# Patient Record
Sex: Female | Born: 1970 | Race: Black or African American | Hispanic: No | Smoking: Never smoker
Health system: Southern US, Community
[De-identification: ages and names within clinical notes are randomized; demographics above are authoritative.]

## PROBLEM LIST (undated history)

## (undated) DIAGNOSIS — T4145XA Adverse effect of unspecified anesthetic, initial encounter: Secondary | ICD-10-CM

## (undated) DIAGNOSIS — G43909 Migraine, unspecified, not intractable, without status migrainosus: Secondary | ICD-10-CM

## (undated) DIAGNOSIS — K219 Gastro-esophageal reflux disease without esophagitis: Secondary | ICD-10-CM

## (undated) DIAGNOSIS — T8859XA Other complications of anesthesia, initial encounter: Secondary | ICD-10-CM

## (undated) DIAGNOSIS — E079 Disorder of thyroid, unspecified: Secondary | ICD-10-CM

## (undated) HISTORY — PX: ABDOMINAL HYSTERECTOMY: SHX81

## (undated) HISTORY — DX: Migraine, unspecified, not intractable, without status migrainosus: G43.909

## (undated) HISTORY — DX: Disorder of thyroid, unspecified: E07.9

---

## 1997-09-05 ENCOUNTER — Inpatient Hospital Stay (HOSPITAL_COMMUNITY): Admission: AD | Admit: 1997-09-05 | Discharge: 1997-09-05 | Payer: Self-pay | Admitting: Obstetrics & Gynecology

## 1997-09-30 ENCOUNTER — Other Ambulatory Visit: Admission: RE | Admit: 1997-09-30 | Discharge: 1997-09-30 | Payer: Self-pay | Admitting: Obstetrics and Gynecology

## 1997-10-16 ENCOUNTER — Inpatient Hospital Stay (HOSPITAL_COMMUNITY): Admission: AD | Admit: 1997-10-16 | Discharge: 1997-10-16 | Payer: Self-pay | Admitting: Obstetrics and Gynecology

## 1997-10-30 ENCOUNTER — Inpatient Hospital Stay (HOSPITAL_COMMUNITY): Admission: AD | Admit: 1997-10-30 | Discharge: 1997-11-03 | Payer: Self-pay | Admitting: Obstetrics and Gynecology

## 1997-11-29 ENCOUNTER — Other Ambulatory Visit: Admission: RE | Admit: 1997-11-29 | Discharge: 1997-11-29 | Payer: Self-pay | Admitting: Obstetrics and Gynecology

## 1999-12-21 ENCOUNTER — Emergency Department (HOSPITAL_COMMUNITY): Admission: EM | Admit: 1999-12-21 | Discharge: 1999-12-21 | Payer: Self-pay | Admitting: Emergency Medicine

## 2000-06-27 ENCOUNTER — Other Ambulatory Visit: Admission: RE | Admit: 2000-06-27 | Discharge: 2000-06-27 | Payer: Self-pay | Admitting: Obstetrics and Gynecology

## 2001-04-03 ENCOUNTER — Encounter: Admission: RE | Admit: 2001-04-03 | Discharge: 2001-04-03 | Payer: Self-pay | Admitting: Obstetrics and Gynecology

## 2001-04-03 ENCOUNTER — Encounter: Payer: Self-pay | Admitting: Obstetrics and Gynecology

## 2001-07-28 ENCOUNTER — Other Ambulatory Visit: Admission: RE | Admit: 2001-07-28 | Discharge: 2001-07-28 | Payer: Self-pay | Admitting: Obstetrics and Gynecology

## 2002-12-13 ENCOUNTER — Other Ambulatory Visit: Admission: RE | Admit: 2002-12-13 | Discharge: 2002-12-13 | Payer: Self-pay | Admitting: Obstetrics and Gynecology

## 2004-03-12 ENCOUNTER — Other Ambulatory Visit: Admission: RE | Admit: 2004-03-12 | Discharge: 2004-03-12 | Payer: Self-pay | Admitting: Obstetrics and Gynecology

## 2005-04-16 ENCOUNTER — Other Ambulatory Visit: Admission: RE | Admit: 2005-04-16 | Discharge: 2005-04-16 | Payer: Self-pay | Admitting: Obstetrics and Gynecology

## 2008-11-15 ENCOUNTER — Encounter: Admission: RE | Admit: 2008-11-15 | Discharge: 2008-11-15 | Payer: Self-pay | Admitting: Obstetrics and Gynecology

## 2010-04-10 ENCOUNTER — Encounter: Admission: RE | Admit: 2010-04-10 | Discharge: 2010-04-10 | Payer: Self-pay | Admitting: Obstetrics and Gynecology

## 2010-06-07 ENCOUNTER — Encounter
Admission: RE | Admit: 2010-06-07 | Discharge: 2010-06-07 | Payer: Self-pay | Source: Home / Self Care | Attending: Obstetrics and Gynecology | Admitting: Obstetrics and Gynecology

## 2011-07-03 ENCOUNTER — Encounter (HOSPITAL_COMMUNITY): Payer: Self-pay | Admitting: Pharmacist

## 2011-07-16 ENCOUNTER — Encounter (HOSPITAL_COMMUNITY)
Admission: RE | Admit: 2011-07-16 | Discharge: 2011-07-16 | Disposition: A | Discharge status: Home / Self Care | Payer: BC Managed Care – PPO | Source: Ambulatory Visit | Attending: Obstetrics and Gynecology | Admitting: Obstetrics and Gynecology

## 2011-07-16 ENCOUNTER — Encounter (HOSPITAL_COMMUNITY): Payer: Self-pay

## 2011-07-16 HISTORY — DX: Other complications of anesthesia, initial encounter: T88.59XA

## 2011-07-16 HISTORY — DX: Adverse effect of unspecified anesthetic, initial encounter: T41.45XA

## 2011-07-16 HISTORY — DX: Gastro-esophageal reflux disease without esophagitis: K21.9

## 2011-07-16 LAB — CBC
HCT: 39.2 % (ref 36.0–46.0)
Hemoglobin: 13.1 g/dL (ref 12.0–15.0)
MCH: 27.5 pg (ref 26.0–34.0)
MCHC: 33.4 g/dL (ref 30.0–36.0)
RBC: 4.77 MIL/uL (ref 3.87–5.11)

## 2011-07-16 LAB — SURGICAL PCR SCREEN: MRSA, PCR: NEGATIVE

## 2011-07-16 NOTE — Patient Instructions (Addendum)
YOUR PROCEDURE IS SCHEDULED ON:07/22/11  ENTER THROUGH THE MAIN ENTRANCE OF [facility] AT:6am  USE DESK PHONE AND DIAL [phone] TO INFORM us OF YOUR ARRIVAL  CALL [phone] IF YOU HAVE ANY QUESTIONS OR PROBLEMS PRIOR TO YOUR ARRIVAL.  REMEMBER: DO NOT EAT OR DRINK AFTER MIDNIGHT :Sunday  SPECIAL INSTRUCTIONS:   YOU MAY BRUSH YOUR TEETH THE MORNING OF SURGERY   TAKE THESE MEDICINES THE DAY OF SURGERY WITH SIP OF WATER:none   DO NOT WEAR JEWELRY, EYE MAKEUP, LIPSTICK OR DARK FINGERNAIL POLISH DO NOT WEAR LOTIONS  DO NOT SHAVE FOR 48 HOURS PRIOR TO SURGERY  YOU WILL NOT BE ALLOWED TO DRIVE YOURSELF HOME.  NAME OF DRIVERVallarie Mare- [phone]

## 2011-07-21 NOTE — H&P (Addendum)
41 yo with fibroids and menorrhagia presents for surgical mngt.  PMHx/PSHx:  c-section x 2 All:  None Meds:  Microgestin FHx:  Prostate ca SHx:  Negative tobacco  AF, VSS Gen - NAD CV - RRR Lungs - CTAB Abd - soft, NT/ND PV - 12 wk size NT uterus Ext - no edema  [place] - multiple intramural fibroids 0.8-1.3cm.  Possible adenomyosis.    A/P  Menorrhagia and fibroids.  Plan for LAVH.  Procedure discussed with patient again and r/b/a discussed.  Pt understands possible need for laparotomy. R/B/A d/w pt and informed consent obtained.  All questions answered

## 2011-07-22 ENCOUNTER — Inpatient Hospital Stay (HOSPITAL_COMMUNITY)
Admission: RE | Admit: 2011-07-22 | Discharge: 2011-07-24 | DRG: 359 | Disposition: A | Discharge status: Home / Self Care | Payer: BC Managed Care – PPO | Source: Ambulatory Visit | Attending: Obstetrics and Gynecology | Admitting: Obstetrics and Gynecology

## 2011-07-22 ENCOUNTER — Encounter (HOSPITAL_COMMUNITY): Payer: Self-pay | Admitting: *Deleted

## 2011-07-22 ENCOUNTER — Encounter (HOSPITAL_COMMUNITY): Payer: Self-pay | Admitting: Anesthesiology

## 2011-07-22 ENCOUNTER — Ambulatory Visit (HOSPITAL_COMMUNITY): Payer: BC Managed Care – PPO | Admitting: Anesthesiology

## 2011-07-22 ENCOUNTER — Encounter (HOSPITAL_COMMUNITY)
Admission: RE | Disposition: A | Discharge status: Home / Self Care | Payer: Self-pay | Source: Ambulatory Visit | Attending: Obstetrics and Gynecology

## 2011-07-22 DIAGNOSIS — D252 Subserosal leiomyoma of uterus: Secondary | ICD-10-CM | POA: Diagnosis present

## 2011-07-22 DIAGNOSIS — N8000 Endometriosis of the uterus, unspecified: Secondary | ICD-10-CM | POA: Diagnosis present

## 2011-07-22 DIAGNOSIS — Z01812 Encounter for preprocedural laboratory examination: Secondary | ICD-10-CM

## 2011-07-22 DIAGNOSIS — D251 Intramural leiomyoma of uterus: Secondary | ICD-10-CM | POA: Diagnosis present

## 2011-07-22 DIAGNOSIS — D250 Submucous leiomyoma of uterus: Secondary | ICD-10-CM | POA: Diagnosis present

## 2011-07-22 DIAGNOSIS — Z5331 Laparoscopic surgical procedure converted to open procedure: Secondary | ICD-10-CM

## 2011-07-22 DIAGNOSIS — N920 Excessive and frequent menstruation with regular cycle: Principal | ICD-10-CM | POA: Diagnosis present

## 2011-07-22 DIAGNOSIS — Z01818 Encounter for other preprocedural examination: Secondary | ICD-10-CM

## 2011-07-22 HISTORY — PX: LAPAROSCOPIC ASSISTED VAGINAL HYSTERECTOMY: SHX5398

## 2011-07-22 LAB — TYPE AND SCREEN

## 2011-07-22 LAB — PREGNANCY, URINE: Preg Test, Ur: NEGATIVE

## 2011-07-22 SURGERY — HYSTERECTOMY, VAGINAL, LAPAROSCOPY-ASSISTED
Anesthesia: General | Site: Abdomen | Wound class: Clean Contaminated

## 2011-07-22 MED ORDER — ROCURONIUM BROMIDE 50 MG/5ML IV SOLN
INTRAVENOUS | Status: AC
  Filled 2011-07-22: qty 1

## 2011-07-22 MED ORDER — HYDROMORPHONE HCL PF 1 MG/ML IJ SOLN
INTRAMUSCULAR | Status: AC
  Filled 2011-07-22: qty 1

## 2011-07-22 MED ORDER — GLYCOPYRROLATE 0.2 MG/ML IJ SOLN
INTRAMUSCULAR | Status: AC
  Filled 2011-07-22: qty 2

## 2011-07-22 MED ORDER — DEXAMETHASONE SODIUM PHOSPHATE 10 MG/ML IJ SOLN
INTRAMUSCULAR | Status: AC
  Filled 2011-07-22: qty 1

## 2011-07-22 MED ORDER — NEOSTIGMINE METHYLSULFATE 1 MG/ML IJ SOLN
INTRAMUSCULAR | Status: AC
  Filled 2011-07-22: qty 10

## 2011-07-22 MED ORDER — LACTATED RINGERS IR SOLN
Status: DC | PRN
  Administered 2011-07-22: 3000 mL

## 2011-07-22 MED ORDER — ONDANSETRON HCL 4 MG/2ML IJ SOLN
4.0000 mg | Freq: Four times a day (QID) | INTRAMUSCULAR | Status: DC | PRN
  Administered 2011-07-22: 4 mg via INTRAVENOUS
  Filled 2011-07-22: qty 2

## 2011-07-22 MED ORDER — MENTHOL 3 MG MT LOZG
1.0000 | LOZENGE | OROMUCOSAL | Status: DC | PRN
  Administered 2011-07-22: 3 mg via ORAL
  Filled 2011-07-22: qty 9

## 2011-07-22 MED ORDER — ROCURONIUM BROMIDE 100 MG/10ML IV SOLN
INTRAVENOUS | Status: DC | PRN
  Administered 2011-07-22: 20 mg via INTRAVENOUS
  Administered 2011-07-22: 40 mg via INTRAVENOUS
  Administered 2011-07-22: 10 mg via INTRAVENOUS

## 2011-07-22 MED ORDER — ONDANSETRON HCL 4 MG/2ML IJ SOLN: 4.0000 mg | Freq: Four times a day (QID) | INTRAMUSCULAR | Status: DC | PRN

## 2011-07-22 MED ORDER — DEXTROSE-NACL 5-0.9 % IV SOLN
INTRAVENOUS | Status: DC
  Administered 2011-07-22 – 2011-07-23 (×3): via INTRAVENOUS

## 2011-07-22 MED ORDER — GLYCOPYRROLATE 0.2 MG/ML IJ SOLN
INTRAMUSCULAR | Status: DC | PRN
  Administered 2011-07-22: 1.2 mg via INTRAVENOUS

## 2011-07-22 MED ORDER — OXYCODONE-ACETAMINOPHEN 5-325 MG PO TABS
1.0000 | ORAL_TABLET | ORAL | Status: DC | PRN
  Administered 2011-07-23 – 2011-07-24 (×5): 2 via ORAL
  Filled 2011-07-22 (×5): qty 2

## 2011-07-22 MED ORDER — PROPOFOL 10 MG/ML IV EMUL
INTRAVENOUS | Status: DC | PRN
  Administered 2011-07-22: 150 mg via INTRAVENOUS

## 2011-07-22 MED ORDER — LIDOCAINE HCL (CARDIAC) 20 MG/ML IV SOLN
INTRAVENOUS | Status: AC
  Filled 2011-07-22: qty 5

## 2011-07-22 MED ORDER — HYDROMORPHONE 0.3 MG/ML IV SOLN
INTRAVENOUS | Status: DC
  Administered 2011-07-22: 1.99 mg via INTRAVENOUS
  Administered 2011-07-22: 11:00:00 via INTRAVENOUS
  Administered 2011-07-22: 2.39 mg via INTRAVENOUS
  Administered 2011-07-22: 23:00:00 via INTRAVENOUS
  Administered 2011-07-22 – 2011-07-23 (×2): 1.99 mg via INTRAVENOUS
  Administered 2011-07-23: 1.19 mg via INTRAVENOUS

## 2011-07-22 MED ORDER — HYDROMORPHONE 0.3 MG/ML IV SOLN
INTRAVENOUS | Status: AC
  Filled 2011-07-22: qty 25

## 2011-07-22 MED ORDER — FENTANYL CITRATE 0.05 MG/ML IJ SOLN
INTRAMUSCULAR | Status: AC
  Filled 2011-07-22: qty 5

## 2011-07-22 MED ORDER — FENTANYL CITRATE 0.05 MG/ML IJ SOLN
INTRAMUSCULAR | Status: DC | PRN
  Administered 2011-07-22: 50 ug via INTRAVENOUS
  Administered 2011-07-22 (×3): 100 ug via INTRAVENOUS
  Administered 2011-07-22: 50 ug via INTRAVENOUS

## 2011-07-22 MED ORDER — NEOSTIGMINE METHYLSULFATE 1 MG/ML IJ SOLN
INTRAMUSCULAR | Status: DC | PRN
  Administered 2011-07-22: 2 mg via INTRAVENOUS

## 2011-07-22 MED ORDER — EPHEDRINE 5 MG/ML INJ
INTRAVENOUS | Status: AC
  Filled 2011-07-22: qty 10

## 2011-07-22 MED ORDER — LIDOCAINE HCL (CARDIAC) 20 MG/ML IV SOLN
INTRAVENOUS | Status: DC | PRN
  Administered 2011-07-22: 60 mg via INTRAVENOUS

## 2011-07-22 MED ORDER — ONDANSETRON HCL 4 MG/2ML IJ SOLN
INTRAMUSCULAR | Status: AC
  Filled 2011-07-22: qty 2

## 2011-07-22 MED ORDER — KETOROLAC TROMETHAMINE 30 MG/ML IJ SOLN
30.0000 mg | Freq: Three times a day (TID) | INTRAMUSCULAR | Status: AC
  Administered 2011-07-22 – 2011-07-24 (×3): 30 mg via INTRAVENOUS
  Filled 2011-07-22 (×3): qty 1

## 2011-07-22 MED ORDER — BUPIVACAINE HCL (PF) 0.25 % IJ SOLN
INTRAMUSCULAR | Status: AC
  Filled 2011-07-22: qty 30

## 2011-07-22 MED ORDER — LACTATED RINGERS IV SOLN
INTRAVENOUS | Status: DC
  Administered 2011-07-22 (×4): via INTRAVENOUS

## 2011-07-22 MED ORDER — NALOXONE HCL 0.4 MG/ML IJ SOLN: 0.4000 mg | INTRAMUSCULAR | Status: DC | PRN

## 2011-07-22 MED ORDER — MIDAZOLAM HCL 5 MG/5ML IJ SOLN
INTRAMUSCULAR | Status: DC | PRN
  Administered 2011-07-22: 2 mg via INTRAVENOUS

## 2011-07-22 MED ORDER — SODIUM CHLORIDE 0.9 % IJ SOLN: 9.0000 mL | INTRAMUSCULAR | Status: DC | PRN

## 2011-07-22 MED ORDER — ONDANSETRON HCL 4 MG/2ML IJ SOLN
INTRAMUSCULAR | Status: DC | PRN
  Administered 2011-07-22: 4 mg via INTRAVENOUS

## 2011-07-22 MED ORDER — DIPHENHYDRAMINE HCL 12.5 MG/5ML PO ELIX
12.5000 mg | ORAL_SOLUTION | Freq: Four times a day (QID) | ORAL | Status: DC | PRN
  Administered 2011-07-22 – 2011-07-23 (×2): 12.5 mg via ORAL
  Filled 2011-07-22 (×2): qty 5

## 2011-07-22 MED ORDER — 0.9 % SODIUM CHLORIDE (POUR BTL) OPTIME
TOPICAL | Status: DC | PRN
  Administered 2011-07-22: 1000 mL

## 2011-07-22 MED ORDER — BUPIVACAINE HCL (PF) 0.25 % IJ SOLN
INTRAMUSCULAR | Status: DC | PRN
  Administered 2011-07-22: 30 mL

## 2011-07-22 MED ORDER — PROPOFOL 10 MG/ML IV EMUL
INTRAVENOUS | Status: AC
  Filled 2011-07-22: qty 20

## 2011-07-22 MED ORDER — EPHEDRINE SULFATE 50 MG/ML IJ SOLN
INTRAMUSCULAR | Status: DC | PRN
  Administered 2011-07-22: 10 mg via INTRAVENOUS

## 2011-07-22 MED ORDER — MIDAZOLAM HCL 2 MG/2ML IJ SOLN
INTRAMUSCULAR | Status: AC
  Filled 2011-07-22: qty 2

## 2011-07-22 MED ORDER — KETOROLAC TROMETHAMINE 30 MG/ML IJ SOLN
30.0000 mg | Freq: Once | INTRAMUSCULAR | Status: AC
  Administered 2011-07-22: 30 mg via INTRAVENOUS

## 2011-07-22 MED ORDER — HYDROMORPHONE HCL PF 1 MG/ML IJ SOLN
0.2500 mg | INTRAMUSCULAR | Status: DC | PRN
  Administered 2011-07-22 (×3): 0.5 mg via INTRAVENOUS

## 2011-07-22 MED ORDER — KETOROLAC TROMETHAMINE 30 MG/ML IJ SOLN
INTRAMUSCULAR | Status: AC
  Filled 2011-07-22: qty 1

## 2011-07-22 MED ORDER — ONDANSETRON HCL 4 MG PO TABS: 4.0000 mg | ORAL_TABLET | Freq: Four times a day (QID) | ORAL | Status: DC | PRN

## 2011-07-22 MED ORDER — DIPHENHYDRAMINE HCL 50 MG/ML IJ SOLN: 12.5000 mg | Freq: Four times a day (QID) | INTRAMUSCULAR | Status: DC | PRN

## 2011-07-22 MED ORDER — DEXTROSE 5 % IV SOLN
2.0000 g | INTRAVENOUS | Status: AC
  Administered 2011-07-22: 2 g via INTRAVENOUS
  Filled 2011-07-22: qty 2

## 2011-07-22 SURGICAL SUPPLY — 43 items
BLADE SURG 10 STRL SS (BLADE) ×4 IMPLANT
CABLE HIGH FREQUENCY MONO STRZ (ELECTRODE) IMPLANT
CANISTER SUCTION 2500CC (MISCELLANEOUS) ×2 IMPLANT
CHLORAPREP W/TINT 26ML (MISCELLANEOUS) ×4 IMPLANT
CLOTH BEACON ORANGE TIMEOUT ST (SAFETY) ×2 IMPLANT
COVER MAYO STAND STRL (DRAPES) ×2 IMPLANT
COVER TABLE BACK 60X90 (DRAPES) ×2 IMPLANT
DECANTER SPIKE VIAL GLASS SM (MISCELLANEOUS) IMPLANT
DERMABOND ADVANCED (GAUZE/BANDAGES/DRESSINGS) ×2
DERMABOND ADVANCED .7 DNX12 (GAUZE/BANDAGES/DRESSINGS) ×2 IMPLANT
DRSG COVADERM 4X10 (GAUZE/BANDAGES/DRESSINGS) ×2 IMPLANT
ELECT REM PT RETURN 9FT ADLT (ELECTROSURGICAL) ×2
ELECTRODE REM PT RTRN 9FT ADLT (ELECTROSURGICAL) ×1 IMPLANT
GAUZE SPONGE 4X4 16PLY XRAY LF (GAUZE/BANDAGES/DRESSINGS) ×2 IMPLANT
GLOVE BIO SURGEON STRL SZ 6.5 (GLOVE) ×4 IMPLANT
GLOVE BIO SURGEON STRL SZ7 (GLOVE) ×2 IMPLANT
GLOVE BIOGEL PI IND STRL 6.5 (GLOVE) ×4 IMPLANT
GLOVE BIOGEL PI IND STRL 7.0 (GLOVE) ×2 IMPLANT
GLOVE BIOGEL PI INDICATOR 6.5 (GLOVE) ×4
GLOVE BIOGEL PI INDICATOR 7.0 (GLOVE) ×2
GOWN PREVENTION PLUS LG XLONG (DISPOSABLE) ×8 IMPLANT
NS IRRIG 1000ML POUR BTL (IV SOLUTION) ×2 IMPLANT
PACK LAVH (CUSTOM PROCEDURE TRAY) ×2 IMPLANT
PROTECTOR NERVE ULNAR (MISCELLANEOUS) ×2 IMPLANT
SEALER TISSUE G2 CVD JAW 45CM (ENDOMECHANICALS) ×2 IMPLANT
SET IRRIG TUBING LAPAROSCOPIC (IRRIGATION / IRRIGATOR) ×2 IMPLANT
SLEEVE Z-THREAD 5X100MM (TROCAR) IMPLANT
SPONGE LAP 18X18 X RAY DECT (DISPOSABLE) ×4 IMPLANT
STAPLER VISISTAT 35W (STAPLE) ×2 IMPLANT
SUT MNCRL 0 MO-4 VIOLET 18 CR (SUTURE) ×3 IMPLANT
SUT MON AB 2-0 CT1 36 (SUTURE) ×2 IMPLANT
SUT MONOCRYL 0 MO 4 18  CR/8 (SUTURE) ×3
SUT PDS AB 0 CTX 60 (SUTURE) ×2 IMPLANT
SUT VIC AB 3-0 PS2 18 (SUTURE) ×1
SUT VIC AB 3-0 PS2 18XBRD (SUTURE) ×1 IMPLANT
SUT VICRYL 0 TIES 12 18 (SUTURE) ×2 IMPLANT
SUT VICRYL 0 UR6 27IN ABS (SUTURE) ×2 IMPLANT
TOWEL OR 17X24 6PK STRL BLUE (TOWEL DISPOSABLE) ×4 IMPLANT
TRAY FOLEY CATH 14FR (SET/KITS/TRAYS/PACK) ×2 IMPLANT
TROCAR Z-THREAD BLADED 5X100MM (TROCAR) ×2 IMPLANT
TROCAR Z-THREAD FIOS 11X100 BL (TROCAR) ×2 IMPLANT
WARMER LAPAROSCOPE (MISCELLANEOUS) ×2 IMPLANT
WATER STERILE IRR 1000ML POUR (IV SOLUTION) ×2 IMPLANT

## 2011-07-22 NOTE — Transfer of Care (Signed)
Immediate Anesthesia Transfer of Care Note  Patient: Brianna Barton  Procedure(s) Performed: Procedure(s) (LRB): LAPAROSCOPIC ASSISTED VAGINAL HYSTERECTOMY (N/A)  Patient Location: PACU  Anesthesia Type: General  Level of Consciousness: awake, alert  and oriented  Airway & Oxygen Therapy: Patient Spontanous Breathing and Patient connected to nasal cannula oxygen  Post-op Assessment: Report given to PACU RN and Post -op Vital signs reviewed and stable  Post vital signs: Reviewed and stable  Complications: No apparent anesthesia complications

## 2011-07-22 NOTE — Anesthesia Procedure Notes (Signed)
Procedure Name: Intubation Date/Time: 07/22/2011 7:37 AM Performed by: Kendal Hymen Pre-anesthesia Checklist: Patient identified, Patient being monitored, Timeout performed, Emergency Drugs available and Suction available Patient Re-evaluated:Patient Re-evaluated prior to inductionOxygen Delivery Method: Circle system utilized Preoxygenation: Pre-oxygenation with 100% oxygen Intubation Type: IV induction Ventilation: Mask ventilation without difficulty Laryngoscope Size: Miller and 2 Grade View: Grade I Tube type: Oral Number of attempts: 1 Airway Equipment and Method: Stylet Placement Confirmation: ETT inserted through vocal cords under direct vision,  positive ETCO2 and breath sounds checked- equal and bilateral Secured at: 21 cm Tube secured with: Tape Dental Injury: Teeth and Oropharynx as per pre-operative assessment

## 2011-07-22 NOTE — Anesthesia Postprocedure Evaluation (Signed)
  Anesthesia Post-op Note  Patient: Brianna Barton  Procedure(s) Performed: Procedure(s) (LRB): LAPAROSCOPIC ASSISTED VAGINAL HYSTERECTOMY (N/A)  Patient Location: [facility]  Anesthesia Type: General  Level of Consciousness: alert  and oriented  Airway and Oxygen Therapy: Patient Spontanous Breathing  Post-op Pain: mild  Post-op Assessment: Patient's Cardiovascular Status Stable and Respiratory Function Stable  Post-op Vital Signs: stable  Complications: No apparent anesthesia complications

## 2011-07-22 NOTE — Op Note (Signed)
07/22/2011  9:06 AM  PATIENT:  Brianna Barton  41 y.o. female  PRE-OPERATIVE DIAGNOSIS:  fibroids  POST-OPERATIVE DIAGNOSIS:  uterine fibroids, mennorrghia  PROCEDURE:  Procedure(s) (LRB): Diagnostic laparoscopy, total abdominal hysterectomy  SURGEON:  Surgeon(s) and Role:    * Zelphia Cairo, MD - Primary    * Meriel Pica, MD - Assisting  PHYSICIAN ASSISTANT: Richarda Overlie, MD  ANESTHESIA:   general  EBL:  Total I/O In: 2000 [I.V.:2000] Out: 220 [Urine:170; Blood:50]  BLOOD ADMINISTERED:none  DRAINS: Urinary Catheter (Foley)   LOCAL MEDICATIONS USED:  MARCAINE     SPECIMEN:  Source of Specimen:  uterus with cervix  DISPOSITION OF SPECIMEN:  PATHOLOGY  COUNTS:  YES  TOURNIQUET:  * No tourniquets in log *  DICTATION: .Other Dictation: Dictation Number   PLAN OF CARE: Admit  PATIENT DISPOSITION:  PACU - hemodynamically stable.   Delay start of Pharmacological VTE agent (>24hrs) due to surgical blood loss or risk of bleeding: yes

## 2011-07-22 NOTE — Anesthesia Preprocedure Evaluation (Signed)
Anesthesia Evaluation  Patient identified by MRN, date of birth, ID band Patient awake    Reviewed: Allergy & Precautions, H&P , Patient's Chart, lab work & pertinent test results, reviewed documented beta blocker date and time   Airway Mallampati: II TM Distance: >3 FB Neck ROM: full    Dental No notable dental hx.    Pulmonary  breath sounds clear to auscultation  Pulmonary exam normal       Cardiovascular Rhythm:regular Rate:Normal     Neuro/Psych    GI/Hepatic Controlled,  Endo/Other    Renal/GU      Musculoskeletal   Abdominal   Peds  Hematology   Anesthesia Other Findings   Reproductive/Obstetrics                           Anesthesia Physical Anesthesia Plan  ASA: II  Anesthesia Plan: General   Post-op Pain Management:    Induction: Intravenous  Airway Management Planned: Oral ETT  Additional Equipment:   Intra-op Plan:   Post-operative Plan:   Informed Consent: I have reviewed the patients History and Physical, chart, labs and discussed the procedure including the risks, benefits and alternatives for the proposed anesthesia with the patient or authorized representative who has indicated his/her understanding and acceptance.   Dental Advisory Given and Dental advisory given  Plan Discussed with: CRNA and Surgeon  Anesthesia Plan Comments: (  Discussed  general anesthesia, including possible nausea, instrumentation of airway, sore throat,pulmonary aspiration, etc. I asked if the were any outstanding questions, or  concerns before we proceeded. )        Anesthesia Quick Evaluation

## 2011-07-22 NOTE — Anesthesia Postprocedure Evaluation (Signed)
Anesthesia Post Note  Patient: Brianna Barton  Procedure(s) Performed: Procedure(s) (LRB): LAPAROSCOPIC ASSISTED VAGINAL HYSTERECTOMY (N/A)  Anesthesia type: GA  Patient location: PACU  Post pain: Pain level controlled  Post assessment: Post-op Vital signs reviewed  Last Vitals:  Filed Vitals:   07/22/11 0607  BP: 143/91  Pulse: 93  Temp: 37 C  Resp: 18    Post vital signs: Reviewed  Level of consciousness: sedated  Complications: No apparent anesthesia complications

## 2011-07-22 NOTE — Progress Notes (Signed)
Day of Surgery Procedure(s) (LRB): LAPAROSCOPIC ASSISTED VAGINAL HYSTERECTOMY (N/A)  Subjective: Patient reports incisional pain, tolerating PO and no problems voiding.    Objective: I have reviewed patient's vital signs, intake and output and medications.  General: alert and cooperative GI: soft, appropriately tender, ND Extremities: extremities normal, atraumatic, no cyanosis or edema  Assessment: s/p Procedure(s) (LRB): LAPAROSCOPIC ASSISTED VAGINAL HYSTERECTOMY (N/A): stable  Plan: Advance diet add toradol   LOS: 0 days    Brianna Barton 07/22/2011, 4:50 PM

## 2011-07-23 ENCOUNTER — Encounter (HOSPITAL_COMMUNITY): Payer: Self-pay | Admitting: Obstetrics and Gynecology

## 2011-07-23 LAB — CBC
MCH: 27.2 pg (ref 26.0–34.0)
MCHC: 33 g/dL (ref 30.0–36.0)
Platelets: 373 10*3/uL (ref 150–400)
RBC: 4.27 MIL/uL (ref 3.87–5.11)

## 2011-07-23 MED ORDER — IBUPROFEN 100 MG/5ML PO SUSP
800.0000 mg | Freq: Three times a day (TID) | ORAL | Status: DC
  Administered 2011-07-23 – 2011-07-24 (×5): 800 mg via ORAL
  Filled 2011-07-23 (×8): qty 40

## 2011-07-23 MED ORDER — ZOLPIDEM TARTRATE 5 MG PO TABS
5.0000 mg | ORAL_TABLET | Freq: Every evening | ORAL | Status: DC | PRN
  Administered 2011-07-23: 5 mg via ORAL
  Filled 2011-07-23: qty 1

## 2011-07-23 NOTE — Progress Notes (Signed)
1 Day Post-Op Procedure(s) (LRB): Diagnostic l/s w/ TAH  Subjective: Patient reports tolerating PO and no problems voiding.    Objective: I have reviewed patient's vital signs, intake and output and labs.  General: alert and cooperative GI: soft, non-tender; bowel sounds normal; no masses,  no organomegaly Vaginal Bleeding: none Incision:  C/d/i  Assessment: s/p Procedure(s) (LRB): Laparoscopy, TAH progressing well  Plan: Encourage ambulation  LOS: 1 day    Kennen Stammer 07/23/2011, 1:31 PM

## 2011-07-23 NOTE — Progress Notes (Signed)
1 Day Post-Op Procedure(s) (LRB): LAPAROSCOPIC ASSISTED VAGINAL HYSTERECTOMY (N/A)  Subjective: Patient reports incisional pain, tolerating PO and no problems voiding.    Objective: I have reviewed patient's vital signs, intake and output, medications and labs.  General: alert and cooperative GI: soft, non-tender; bowel sounds normal; no masses,  no organomegaly Extremities: extremities normal, atraumatic, no cyanosis or edema Vaginal Bleeding: minimal  Assessment: s/p Procedure(s) (LRB): LAPAROSCOPIC ASSISTED VAGINAL HYSTERECTOMY (N/A): progressing well and tolerating diet  Plan: Advance diet Encourage ambulation Advance to PO medication Discontinue IV fluids  LOS: 1 day    Vernia Teem 07/23/2011, 8:43 AM

## 2011-07-23 NOTE — Progress Notes (Signed)
UR Chart review completed.  

## 2011-07-24 MED ORDER — IBUPROFEN 200 MG PO TABS: 800.0000 mg | ORAL_TABLET | Freq: Three times a day (TID) | ORAL | Status: AC | PRN

## 2011-07-24 MED ORDER — OXYCODONE-ACETAMINOPHEN 5-325 MG PO TABS: 1.0000 | ORAL_TABLET | ORAL | Status: AC | PRN

## 2011-07-24 MED ORDER — BENZONATATE 100 MG PO CAPS
100.0000 mg | ORAL_CAPSULE | Freq: Three times a day (TID) | ORAL | Status: DC | PRN
  Administered 2011-07-24: 100 mg via ORAL
  Filled 2011-07-24: qty 1

## 2011-07-24 MED ORDER — BENZONATATE 100 MG PO CAPS: 100.0000 mg | ORAL_CAPSULE | Freq: Three times a day (TID) | ORAL | Status: AC | PRN

## 2011-07-24 NOTE — Progress Notes (Signed)
2 Days Post-Op Procedure(s) (LRB): LAPAROSCOPy with TAH  Subjective: Patient reports incisional pain, tolerating PO, + flatus and no problems voiding.  Has not taken pain meds this morning  Objective: I have reviewed patient's vital signs, intake and output, medications and labs.  Gen - NAD, alert Abd - softly distended, appropriately tender Ext - NT  Assessment: s/p Procedure(s) (LRB): LAPAROSCOPY with TAH: stable, progressing well, tolerating diet and incisional pain  Plan: Advance diet Encourage ambulation give dose percocet w/ breakfast.  reassess for discharge this afternoon  LOS: 2 days    Brianna Barton 07/24/2011, 8:04 AM

## 2011-07-24 NOTE — Progress Notes (Signed)
Pt out in w/c  Reminded to avoid constipation  And to  Return to office to have staples removed  Monday

## 2011-07-24 NOTE — Progress Notes (Signed)
Pt d/c home  Teaching complete  Questions answered

## 2011-07-24 NOTE — Discharge Instructions (Signed)
Hysterectomy Care After Refer to this sheet in the next few weeks. These instructions provide you with information on caring for yourself after your procedure. Your caregiver may also give you more specific instructions. Your treatment has been planned according to current medical practices, but problems sometimes occur. Call your caregiver if you have any problems or questions after your procedure. HOME CARE INSTRUCTIONS  Healing will take time. You may have discomfort, tenderness, swelling, and bruising at the surgical site for about 2 weeks. This is normal and will get better as time goes on.  Only take over-the-counter or prescription medicines for pain, discomfort, or fever as directed by your caregiver.   Do not take aspirin. It can cause bleeding.   Do not drive when taking pain medicine.   Follow your caregiver's advice regarding exercise, lifting, driving, and general activities. No lifting for 4 weeks, No driving for 2-3 weeks.  No intercourse for 8 weeks  Resume your usual diet as directed and allowed.   Get plenty of rest and sleep.   Do not douche, use tampons, or have sexual intercourse for at least 6 weeks or until your caregiver gives you permission.   Change your bandages (dressings) as directed by your caregiver.   Monitor your temperature.   Take showers instead of baths for 2 to 3 weeks.   Do not drink alcohol until your caregiver gives you permission.   If you are constipated, you may take a mild laxative with your caregiver's permission. Bran foods may help with constipation problems. Drinking enough fluids to keep your urine clear or pale yellow may help as well.   Try to have someone home with you for 1 or 2 weeks to help around the house.   Keep all of your follow-up appointments as directed by your caregiver.  SEEK MEDICAL CARE IF:   You have swelling, redness, or increasing pain in the surgical cut (incision) area.   You have pus coming from the  incision.   You notice a bad smell coming from the incision or dressing.   You have swelling, redness, or pain around the intravenous (IV) site.   Your incision breaks open.   You feel dizzy or lightheaded.   You have pain or bleeding when you urinate.   You have persistent diarrhea.   You have persistent nausea and vomiting.   You have abnormal vaginal discharge.   You have a rash.   You have any type of abnormal reaction or develop an allergy to your medicine.   Your pain is not controlled with your prescribed medicine.  SEEK IMMEDIATE MEDICAL CARE IF:   You have a fever.   You have severe abdominal pain.   You have chest pain.   You have shortness of breath.   You faint.   You have pain, swelling, or redness of your leg.   You have heavy vaginal bleeding with blood clots.  MAKE SURE YOU:  Understand these instructions.   Will watch your condition.   Will get help right away if you are not doing well or get worse.  Document Released: 11/16/2004 Document Revised: 04/18/2011 Document Reviewed: 12/14/2010 [facility] Patient Information 2012 [place], [place].

## 2011-07-24 NOTE — Progress Notes (Signed)
2 Days Post-Op Procedure(s) (LRB): LAPAROSCOPy with TAH  Subjective: Patient reports tolerating PO, + flatus, + BM and no problems voiding.  Pain better controlled  Objective: I have reviewed patient's vital signs, intake and output and labs.  General: alert and cooperative GI: soft, non-tender; bowel sounds normal; no masses,  no organomegaly Extremities: extremities normal, atraumatic, no cyanosis or edema  Assessment: s/p Procedure(s) (LRB): LAPAROSCOPY with TAH: stable and progressing well  Plan: Discharge home  LOS: 2 days    Mckinna Demars 07/24/2011, 1:12 PM

## 2011-07-25 NOTE — Discharge Summary (Signed)
NAMELOYOLA, SANTINO          ACCOUNT NO.:  [id number]  MEDICAL RECORD NO.:  [id number]  LOCATION:  9303                          FACILITY:  [facility]  PHYSICIAN:  Zelphia Cairo, MD    DATE OF BIRTH:  Jul 09, 1970  DATE OF ADMISSION:  07/22/2011 DATE OF DISCHARGE:  07/24/2011                              DISCHARGE SUMMARY   PROCEDURES: 1. Diagnostic laparoscopy. 2. Total abdominal hysterectomy.  DISCHARGE DIAGNOSES: 1. Fibroids. 2. Pelvic adhesions. 3. Menorrhagia.  HOSPITAL COURSE:  The patient was admitted for postoperative recovery after total abdominal hysterectomy.  Please see operative note for further details of the surgery.  Her pain was initially controlled with an IV PCA and the Foley catheter remained in place.  On postoperative day #1, her hemoglobin was stable at 11.6, she was tolerating a regular diet, and so her PCA was discontinued and she was advanced to p.o. medications.  Once she was able to ambulate on postoperative day #1, her Foley catheter was discontinued and she was able to urinate without difficulty.  She continued to have increased incisional pain especially with coughing.  On postoperative day #2, Tessalon Perles were added and she was encouraged to use her Percocet for pain management every 4-6 hours as needed.  On the afternoon of postoperative day #2, she was tolerating a regular diet, passing flatus.  She has had a bowel movement and her pain was well managed.  She was felt to be stable for discharge. Discharge instructions and prescriptions were given.  She will follow up in 5 days for staple removal.     Zelphia Cairo, MD     GA/MEDQ  D:  07/24/2011  T:  07/25/2011  Job:  [id number]

## 2011-08-12 NOTE — Op Note (Signed)
Brianna Barton          ACCOUNT NO.:  [id number]  MEDICAL RECORD NO.:  [id number]  LOCATION:  9303                          FACILITY:  [facility]  PHYSICIAN:  Zelphia Cairo, MD    DATE OF BIRTH:  Sep 14, 1970  DATE OF PROCEDURE:  07/22/2011 DATE OF DISCHARGE:  07/24/2011                              OPERATIVE REPORT   PREOPERATIVE DIAGNOSES: 1. Menorrhagia. 2. Fibroid.  POSTOPERATIVE DIAGNOSES: 1. Menorrhagia. 2. Fibroid. 3. Pelvic adhesions.  SURGERY: 1. Diagnostic laparoscopy. 2. Laparotomy. 3. Total abdominal hysterectomy. 4. Lysis of adhesions.  SURGEON:  Zelphia Cairo, MD  ASSISTANT:  Duke Salvia. Marcelle Overlie, MD  COMPLICATIONS:  None.  SPECIMEN:  Uterus with cervix.  CONDITION:  Stable and extubated to recovery room.  PROCEDURE:  Brianna Barton was taken to the operating room after informed consent was obtained.  She was given general anesthesia and placed in the dorsal lithotomy position with Allen stirrups.  She was prepped and draped in a sterile fashion and a Foley catheter was inserted.  Bivalve speculum was placed in the vagina and a single-tooth tenaculum was placed on the anterior lip of the cervix.  Hulka clamp was placed through the cervix and attached to the anterior lip.  Tenaculum and speculum were removed and our attention was turned to the abdomen. Local anesthesia was injected and the scalpel was used to make an infraumbilical skin incision.  Optical trocar was then inserted under direct visualization.  Once intraperitoneal placement was confirmed, CO2 was turned on and the abdomen and pelvis were insufflated.  A survey of the pelvis revealed significant adhesions of the anterior uterine wall to the anterior abdominal wall.  This band of adhesions extended down to the bladder.  Because there was not a clear plane between the uterus and the bladder, decision for laparotomy and abdominal hysterectomy was negative.  The trocar was removed from the  abdomen.  A deep stitch was placed in the infraumbilical skin incision and the skin was reapproximated.  Dermabond was placed over this incisions.  Hulka clamp was removed.  A Pfannenstiel skin incision was then made and carried down to the underlying fascia.  The fascia was incised in the midline and extended laterally using curved Mayo scissors.  Kocher clamps were used to grasp the superior and inferior portions of the fascia.  The underlying rectus muscles were dissected off.  Peritoneum was identified and entered sharply.  This was extended superiorly and inferiorly with good visualization of the bladder.  Adhesions of the anterior abdominal wall and the anterior uterus were then dissected free using sharp dissection with Metzenbaum scissors.  Once lysis of adhesions was complete, a thyroid tenaculum was placed on the uterus to be used for traction.  The utero-ovarian ligament was then clamped, cut, and doubly suture ligated bilaterally.  An O'Connor-O'Sullivan retractor was placed and the bowel was packed away with moist laparotomy sponges.  The anterior leaf of the broad ligament was incised along the bladder reflection to the midline bilaterally.  The bladder was then gently dissected off of the lower uterine segment and cervix using sharp dissection.  The uterine arteries were then skeletonized bilaterally, clamped with Heaney clamps, transected, and suture ligated.  Hemostasis was assured  bilaterally. Uterosacral ligaments were clamped bilaterally, transected, and suture ligated in a similar fashion.  Again, the bladder was dissected free from the lower cervix, and the cervix and uterus were amputated using curved scissors.  Vaginal cuff angles were closed with figure-of-eight stitches, and the remainder of the vaginal cuff was closed using a series of #0 Vicryl figure-of-eight sutures.  Hemostasis was assured. The pelvis was copiously irrigated with warm normal saline.   All laparotomy sponges and instruments were removed from the abdomen.  The peritoneum was closed with 0 Monocryl.  The fascia was closed with a 0 PDS and the skin was reapproximated with staples.  Sponge, lap, needle, and instrument counts were correct x2.  She was taken to the PACU awake and in stable condition.     Zelphia Cairo, MD     GA/MEDQ  D:  08/12/2011  T:  08/12/2011  Job:  [id number]

## 2012-07-16 ENCOUNTER — Other Ambulatory Visit (HOSPITAL_COMMUNITY): Payer: Self-pay | Admitting: Obstetrics and Gynecology

## 2012-07-16 DIAGNOSIS — E049 Nontoxic goiter, unspecified: Secondary | ICD-10-CM

## 2012-07-20 ENCOUNTER — Ambulatory Visit (HOSPITAL_COMMUNITY)
Admission: RE | Admit: 2012-07-20 | Discharge: 2012-07-20 | Disposition: A | Discharge status: Home / Self Care | Payer: BC Managed Care – PPO | Source: Ambulatory Visit | Attending: Obstetrics and Gynecology | Admitting: Obstetrics and Gynecology

## 2012-07-20 DIAGNOSIS — E049 Nontoxic goiter, unspecified: Secondary | ICD-10-CM

## 2012-07-20 DIAGNOSIS — E042 Nontoxic multinodular goiter: Secondary | ICD-10-CM | POA: Insufficient documentation

## 2012-08-24 ENCOUNTER — Other Ambulatory Visit (HOSPITAL_COMMUNITY): Payer: Self-pay | Admitting: Endocrinology

## 2012-08-24 DIAGNOSIS — E049 Nontoxic goiter, unspecified: Secondary | ICD-10-CM

## 2013-02-12 ENCOUNTER — Ambulatory Visit (HOSPITAL_COMMUNITY): Admission: RE | Admit: 2013-02-12 | Payer: BC Managed Care – PPO | Source: Ambulatory Visit

## 2013-02-23 ENCOUNTER — Other Ambulatory Visit (HOSPITAL_COMMUNITY): Payer: Self-pay | Admitting: Endocrinology

## 2013-02-23 DIAGNOSIS — E049 Nontoxic goiter, unspecified: Secondary | ICD-10-CM

## 2013-03-02 ENCOUNTER — Ambulatory Visit (HOSPITAL_COMMUNITY): Payer: BC Managed Care – PPO

## 2013-07-17 ENCOUNTER — Ambulatory Visit: Payer: BC Managed Care – PPO

## 2013-07-17 ENCOUNTER — Ambulatory Visit (INDEPENDENT_AMBULATORY_CARE_PROVIDER_SITE_OTHER): Payer: BC Managed Care – PPO | Admitting: Family Medicine

## 2013-07-17 VITALS — BP 130/84 | HR 84 | Temp 98.5°F | Resp 16 | Ht 66.5 in | Wt 194.0 lb

## 2013-07-17 DIAGNOSIS — R05 Cough: Secondary | ICD-10-CM

## 2013-07-17 DIAGNOSIS — E039 Hypothyroidism, unspecified: Secondary | ICD-10-CM | POA: Insufficient documentation

## 2013-07-17 DIAGNOSIS — R059 Cough, unspecified: Secondary | ICD-10-CM

## 2013-07-17 DIAGNOSIS — J029 Acute pharyngitis, unspecified: Secondary | ICD-10-CM

## 2013-07-17 DIAGNOSIS — J189 Pneumonia, unspecified organism: Secondary | ICD-10-CM

## 2013-07-17 MED ORDER — HYDROCODONE-HOMATROPINE 5-1.5 MG/5ML PO SYRP: 5.0000 mL | ORAL_SOLUTION | Freq: Three times a day (TID) | ORAL | Status: DC | PRN

## 2013-07-17 MED ORDER — CEFDINIR 300 MG PO CAPS: 300.0000 mg | ORAL_CAPSULE | Freq: Two times a day (BID) | ORAL | Status: DC

## 2013-07-17 NOTE — Progress Notes (Signed)
[facility] [address] [place] [address] [phone]  Date:  07/17/2013   Name:  LORAYNE GETCHELL   DOB:  1970/08/16   MRN:  [id number]  PCP:  Marguerita Merles, MD    Chief Complaint: Cough   History of Present Illness:  Brianna Barton is a 43 y.o. very pleasant female patient who presents with the following:  She is here today with illness for about 4 days.  She has noted a cough, congestion in her nose, eye running, laryngitis.   No fever- she does have some aches but thinks they are non- related.   She has a ST but no earache. No GI symptoms She is generally healthy  She has a history of hypothyroidism Her son has been ill with the same thing but he had a fever.    Last night she took some OTC cough syrup and she slept well.  She also took an OTC dayquil type medication.   History of hysterectomy.    There are no active problems to display for this patient.   Past Medical History  Diagnosis Date  . GERD (gastroesophageal reflux disease)     no meds  . Complication of anesthesia     pt hallucinated after CS 14 yrs ago- ? med induced    Past Surgical History  Procedure Laterality Date  . Cesarean section      x2  . Laparoscopic assisted vaginal hysterectomy  07/22/2011    Procedure: LAPAROSCOPIC ASSISTED VAGINAL HYSTERECTOMY;  Surgeon: Marylynn Pearson, MD;  Location: [facility];  Service: Gynecology;  Laterality: N/A;  Laparotomy Abdominal hysterectomy    History  Substance Use Topics  . Smoking status: Never Smoker   . Smokeless tobacco: Not on file  . Alcohol Use: 0.6 oz/week    1 Cans of beer per week    History reviewed. No pertinent family history.  No Known Allergies  Medication list has been reviewed and updated.  Current Outpatient Prescriptions on File Prior to Visit  Medication Sig Dispense Refill  . fish oil-omega-3 fatty acids 1000 MG capsule Take 1 g by mouth daily.      Marland Kitchen loratadine (CLARITIN) 10 MG tablet  Take 10 mg by mouth daily as needed. For allergies      . OLIVE LEAF EXTRACT PO Take 2 tablets by mouth every morning.      Marland Kitchen OVER THE COUNTER MEDICATION Take 2 tablets by mouth daily. Multivitamin - Gummy      . OVER THE COUNTER MEDICATION Take 1 tablet by mouth every morning. Pt takes Ultimate-10 probiotic 300mg       . [DISCONTINUED] diphenhydrAMINE (BENADRYL) 25 MG tablet Take 50 mg by mouth at bedtime as needed. For sleep      . [DISCONTINUED] norethindrone-ethinyl estradiol (MICROGESTIN,JUNEL,LOESTRIN) 1-20 MG-MCG tablet Take 1 tablet by mouth daily. Pt will stop after procedure on 07/22/11       No current facility-administered medications on file prior to visit.    Review of Systems:  As per HPI- otherwise negative.   Physical Examination: Filed Vitals:   07/17/13 0849  BP: 140/78  Pulse: 102  Temp: 98.5 F (36.9 C)  Resp: 16   Filed Vitals:   07/17/13 0849  Height: 5' 6.5" (1.689 m)  Weight: 194 lb (87.998 kg)   Body mass index is 30.85 kg/(m^2). Ideal Body Weight: Weight in (lb) to have BMI = 25: 156.9  GEN: WDWN, NAD, Non-toxic, A & O x 3, looks  well HEENT: Atraumatic, Normocephalic. Neck supple. No masses, No LAD.  Bilateral TM wnl, oropharynx normal.  PEERL,EOMI.   Ears and Nose: No external deformity. CV: RRR, No M/G/R. No JVD. No thrill. No extra heart sounds. PULM: CTA B, no wheezes, crackles, rhonchi. No retractions. No resp. distress. No accessory muscle use. ABD: S, NT, ND EXTR: No c/c/e NEURO Normal gait.  PSYCH: Normally interactive. Conversant. Not depressed or anxious appearing.  Calm demeanor.   [facility] reading (PRIMARY) by  Dr. Lorelei Pont. CXR: patchy infiltrate right lung CHEST 2 VIEW  COMPARISON: None.  FINDINGS: The heart size and mediastinal contours are within normal limits. Both lungs are clear. The visualized skeletal structures are unremarkable.  IMPRESSION: Normal chest radiographs   Assessment and Plan: Walking pneumonia - Plan:  cefdinir (OMNICEF) 300 MG capsule  Cough - Plan: DG Chest 2 View, HYDROcodone-homatropine (HYCODAN) 5-1.5 MG/5ML syrup  Acute pharyngitis  Treat for illness as above.  She will let me know if not better in the next few days- Sooner if worse.     Signed Lamar Blinks, MD

## 2013-07-17 NOTE — Patient Instructions (Signed)
We are going to treat you for walking pneumonia with omnicef (antibiotic) and hycodan cough syrup.  The cough syrup will make you sleepy so do not use it when you need to drive.  If you are not feeling better in the next few days please let us know- Sooner if worse.

## 2013-09-02 ENCOUNTER — Other Ambulatory Visit: Payer: Self-pay | Admitting: Obstetrics and Gynecology

## 2013-09-02 DIAGNOSIS — R928 Other abnormal and inconclusive findings on diagnostic imaging of breast: Secondary | ICD-10-CM

## 2013-09-13 ENCOUNTER — Ambulatory Visit
Admission: RE | Admit: 2013-09-13 | Discharge: 2013-09-13 | Disposition: A | Discharge status: Home / Self Care | Payer: BC Managed Care – PPO | Source: Ambulatory Visit | Attending: Obstetrics and Gynecology | Admitting: Obstetrics and Gynecology

## 2013-09-13 ENCOUNTER — Encounter (INDEPENDENT_AMBULATORY_CARE_PROVIDER_SITE_OTHER): Payer: Self-pay

## 2013-09-13 DIAGNOSIS — R928 Other abnormal and inconclusive findings on diagnostic imaging of breast: Secondary | ICD-10-CM

## 2014-02-02 ENCOUNTER — Ambulatory Visit (INDEPENDENT_AMBULATORY_CARE_PROVIDER_SITE_OTHER): Payer: BC Managed Care – PPO | Admitting: Family Medicine

## 2014-02-02 ENCOUNTER — Ambulatory Visit (INDEPENDENT_AMBULATORY_CARE_PROVIDER_SITE_OTHER): Payer: BC Managed Care – PPO

## 2014-02-02 VITALS — BP 120/70 | HR 93 | Temp 98.3°F | Resp 16 | Ht 66.5 in | Wt 180.0 lb

## 2014-02-02 DIAGNOSIS — M5441 Lumbago with sciatica, right side: Secondary | ICD-10-CM

## 2014-02-02 DIAGNOSIS — M5430 Sciatica, unspecified side: Secondary | ICD-10-CM

## 2014-02-02 DIAGNOSIS — T148XXA Other injury of unspecified body region, initial encounter: Secondary | ICD-10-CM

## 2014-02-02 MED ORDER — OXYCODONE-ACETAMINOPHEN 5-325 MG PO TABS: 1.0000 | ORAL_TABLET | Freq: Three times a day (TID) | ORAL | Status: DC | PRN

## 2014-02-02 MED ORDER — CYCLOBENZAPRINE HCL 5 MG PO TABS: 5.0000 mg | ORAL_TABLET | Freq: Three times a day (TID) | ORAL | Status: DC | PRN

## 2014-02-02 MED ORDER — MELOXICAM 7.5 MG PO TABS: 7.5000 mg | ORAL_TABLET | Freq: Two times a day (BID) | ORAL | Status: DC | PRN

## 2014-02-02 MED ORDER — METHYLPREDNISOLONE (PAK) 4 MG PO TABS: ORAL_TABLET | ORAL | Status: DC

## 2014-02-02 NOTE — Patient Instructions (Signed)
Sciatica with Rehab The sciatic nerve runs from the back down the leg and is responsible for sensation and control of the muscles in the back (posterior) side of the thigh, lower leg, and foot. Sciatica is a condition that is characterized by inflammation of this nerve.  SYMPTOMS   Signs of nerve damage, including numbness and/or weakness along the posterior side of the lower extremity.  Pain in the back of the thigh that may also travel down the leg.  Pain that worsens when sitting for long periods of time.  Occasionally, pain in the back or buttock. CAUSES  Inflammation of the sciatic nerve is the cause of sciatica. The inflammation is due to something irritating the nerve. Common sources of irritation include:  Sitting for long periods of time.  Direct trauma to the nerve.  Arthritis of the spine.  Herniated or ruptured disk.  Slipping of the vertebrae (spondylolisthesis).  Pressure from soft tissues, such as muscles or ligament-like tissue (fascia). RISK INCREASES WITH:  Sports that place pressure or stress on the spine (football or weightlifting).  Poor strength and flexibility.  Failure to warm up properly before activity.  Family history of low back pain or disk disorders.  Previous back injury or surgery.  Poor body mechanics, especially when lifting, or poor posture. PREVENTION   Warm up and stretch properly before activity.  Maintain physical fitness:  Strength, flexibility, and endurance.  Cardiovascular fitness.  Learn and use proper technique, especially with posture and lifting. When possible, have coach correct improper technique.  Avoid activities that place stress on the spine. PROGNOSIS If treated properly, then sciatica usually resolves within 6 weeks. However, occasionally surgery is necessary.  RELATED COMPLICATIONS   Permanent nerve damage, including pain, numbness, tingle, or weakness.  Chronic back pain.  Risks of surgery: infection,  bleeding, nerve damage, or damage to surrounding tissues. TREATMENT Treatment initially involves resting from any activities that aggravate your symptoms. The use of ice and medication may help reduce pain and inflammation. The use of strengthening and stretching exercises may help reduce pain with activity. These exercises may be performed at home or with referral to a therapist. A therapist may recommend further treatments, such as transcutaneous electronic nerve stimulation (TENS) or ultrasound. Your caregiver may recommend corticosteroid injections to help reduce inflammation of the sciatic nerve. If symptoms persist despite non-surgical (conservative) treatment, then surgery may be recommended. MEDICATION  If pain medication is necessary, then nonsteroidal anti-inflammatory medications, such as aspirin and ibuprofen, or other minor pain relievers, such as acetaminophen, are often recommended.  Do not take pain medication for 7 days before surgery.  Prescription pain relievers may be given if deemed necessary by your caregiver. Use only as directed and only as much as you need.  Ointments applied to the skin may be helpful.  Corticosteroid injections may be given by your caregiver. These injections should be reserved for the most serious cases, because they may only be given a certain number of times. HEAT AND COLD  Cold treatment (icing) relieves pain and reduces inflammation. Cold treatment should be applied for 10 to 15 minutes every 2 to 3 hours for inflammation and pain and immediately after any activity that aggravates your symptoms. Use ice packs or massage the area with a piece of ice (ice massage).  Heat treatment may be used prior to performing the stretching and strengthening activities prescribed by your caregiver, physical therapist, or athletic trainer. Use a heat pack or soak the injury in warm water.   SEEK MEDICAL CARE IF:  Treatment seems to offer no benefit, or the condition  worsens.  Any medications produce adverse side effects. EXERCISES  RANGE OF MOTION (ROM) AND STRETCHING EXERCISES - Sciatica Most people with sciatic will find that their symptoms worsen with either excessive bending forward (flexion) or arching at the low back (extension). The exercises which will help resolve your symptoms will focus on the opposite motion. Your physician, physical therapist or athletic trainer will help you determine which exercises will be most helpful to resolve your low back pain. Do not complete any exercises without first consulting with your clinician. Discontinue any exercises which worsen your symptoms until you speak to your clinician. If you have pain, numbness or tingling which travels down into your buttocks, leg or foot, the goal of the therapy is for these symptoms to move closer to your back and eventually resolve. Occasionally, these leg symptoms will get better, but your low back pain may worsen; this is typically an indication of progress in your rehabilitation. Be certain to be very alert to any changes in your symptoms and the activities in which you participated in the 24 hours prior to the change. Sharing this information with your clinician will allow him/her to most efficiently treat your condition. These exercises may help you when beginning to rehabilitate your injury. Your symptoms may resolve with or without further involvement from your physician, physical therapist or athletic trainer. While completing these exercises, remember:   Restoring tissue flexibility helps normal motion to return to the joints. This allows healthier, less painful movement and activity.  An effective stretch should be held for at least 30 seconds.  A stretch should never be painful. You should only feel a gentle lengthening or release in the stretched tissue. FLEXION RANGE OF MOTION AND STRETCHING EXERCISES: STRETCH - Flexion, Single Knee to Chest   Lie on a firm bed or floor  with both legs extended in front of you.  Keeping one leg in contact with the floor, bring your opposite knee to your chest. Hold your leg in place by either grabbing behind your thigh or at your knee.  Pull until you feel a gentle stretch in your low back. Hold __________ seconds.  Slowly release your grasp and repeat the exercise with the opposite side. Repeat __________ times. Complete this exercise __________ times per day.  STRETCH - Flexion, Double Knee to Chest  Lie on a firm bed or floor with both legs extended in front of you.  Keeping one leg in contact with the floor, bring your opposite knee to your chest.  Tense your stomach muscles to support your back and then lift your other knee to your chest. Hold your legs in place by either grabbing behind your thighs or at your knees.  Pull both knees toward your chest until you feel a gentle stretch in your low back. Hold __________ seconds.  Tense your stomach muscles and slowly return one leg at a time to the floor. Repeat __________ times. Complete this exercise __________ times per day.  STRETCH - Low Trunk Rotation   Lie on a firm bed or floor. Keeping your legs in front of you, bend your knees so they are both pointed toward the ceiling and your feet are flat on the floor.  Extend your arms out to the side. This will stabilize your upper body by keeping your shoulders in contact with the floor.  Gently and slowly drop both knees together to one side until   you feel a gentle stretch in your low back. Hold for __________ seconds.  Tense your stomach muscles to support your low back as you bring your knees back to the starting position. Repeat the exercise to the other side. Repeat __________ times. Complete this exercise __________ times per day  EXTENSION RANGE OF MOTION AND FLEXIBILITY EXERCISES: STRETCH - Extension, Prone on Elbows  Lie on your stomach on the floor, a bed will be too soft. Place your palms about shoulder  width apart and at the height of your head.  Place your elbows under your shoulders. If this is too painful, stack pillows under your chest.  Allow your body to relax so that your hips drop lower and make contact more completely with the floor.  Hold this position for __________ seconds.  Slowly return to lying flat on the floor. Repeat __________ times. Complete this exercise __________ times per day.  RANGE OF MOTION - Extension, Prone Press Ups  Lie on your stomach on the floor, a bed will be too soft. Place your palms about shoulder width apart and at the height of your head.  Keeping your back as relaxed as possible, slowly straighten your elbows while keeping your hips on the floor. You may adjust the placement of your hands to maximize your comfort. As you gain motion, your hands will come more underneath your shoulders.  Hold this position __________ seconds.  Slowly return to lying flat on the floor. Repeat __________ times. Complete this exercise __________ times per day.  STRENGTHENING EXERCISES - Sciatica  These exercises may help you when beginning to rehabilitate your injury. These exercises should be done near your "sweet spot." This is the neutral, low-back arch, somewhere between fully rounded and fully arched, that is your least painful position. When performed in this safe range of motion, these exercises can be used for people who have either a flexion or extension based injury. These exercises may resolve your symptoms with or without further involvement from your physician, physical therapist or athletic trainer. While completing these exercises, remember:   Muscles can gain both the endurance and the strength needed for everyday activities through controlled exercises.  Complete these exercises as instructed by your physician, physical therapist or athletic trainer. Progress with the resistance and repetition exercises only as your caregiver advises.  You may  experience muscle soreness or fatigue, but the pain or discomfort you are trying to eliminate should never worsen during these exercises. If this pain does worsen, stop and make certain you are following the directions exactly. If the pain is still present after adjustments, discontinue the exercise until you can discuss the trouble with your clinician. STRENGTHENING - Deep Abdominals, Pelvic Tilt   Lie on a firm bed or floor. Keeping your legs in front of you, bend your knees so they are both pointed toward the ceiling and your feet are flat on the floor.  Tense your lower abdominal muscles to press your low back into the floor. This motion will rotate your pelvis so that your tail bone is scooping upwards rather than pointing at your feet or into the floor.  With a gentle tension and even breathing, hold this position for __________ seconds. Repeat __________ times. Complete this exercise __________ times per day.  STRENGTHENING - Abdominals, Crunches   Lie on a firm bed or floor. Keeping your legs in front of you, bend your knees so they are both pointed toward the ceiling and your feet are flat on the   floor. Cross your arms over your chest.  Slightly tip your chin down without bending your neck.  Tense your abdominals and slowly lift your trunk high enough to just clear your shoulder blades. Lifting higher can put excessive stress on the low back and does not further strengthen your abdominal muscles.  Control your return to the starting position. Repeat __________ times. Complete this exercise __________ times per day.  STRENGTHENING - Quadruped, Opposite UE/ Lift  Assume a hands and knees position on a firm surface. Keep your hands under your shoulders and your knees under your hips. You may place padding under your knees for comfort.  Find your neutral spine and gently tense your abdominal muscles so that you can maintain this position. Your shoulders and hips should form a rectangle  that is parallel with the floor and is not twisted.  Keeping your trunk steady, lift your right hand no higher than your shoulder and then your left leg no higher than your hip. Make sure you are not holding your breath. Hold this position __________ seconds.  Continuing to keep your abdominal muscles tense and your back steady, slowly return to your starting position. Repeat with the opposite arm and leg. Repeat __________ times. Complete this exercise __________ times per day.  STRENGTHENING - Abdominals and Quadriceps, Straight Leg Raise   Lie on a firm bed or floor with both legs extended in front of you.  Keeping one leg in contact with the floor, bend the other knee so that your foot can rest flat on the floor.  Find your neutral spine, and tense your abdominal muscles to maintain your spinal position throughout the exercise.  Slowly lift your straight leg off the floor about 6 inches for a count of 15, making sure to not hold your breath.  Still keeping your neutral spine, slowly lower your leg all the way to the floor. Repeat this exercise with each leg __________ times. Complete this exercise __________ times per day. POSTURE AND BODY MECHANICS CONSIDERATIONS - Sciatica Keeping correct posture when sitting, standing or completing your activities will reduce the stress put on different body tissues, allowing injured tissues a chance to heal and limiting painful experiences. The following are general guidelines for improved posture. Your physician or physical therapist will provide you with any instructions specific to your needs. While reading these guidelines, remember:  The exercises prescribed by your provider will help you have the flexibility and strength to maintain correct postures.  The correct posture provides the optimal environment for your joints to work. All of your joints have less wear and tear when properly supported by a spine with good posture. This means you will  experience a healthier, less painful body.  Correct posture must be practiced with all of your activities, especially prolonged sitting and standing. Correct posture is as important when doing repetitive low-stress activities (typing) as it is when doing a single heavy-load activity (lifting). RESTING POSITIONS Consider which positions are most painful for you when choosing a resting position. If you have pain with flexion-based activities (sitting, bending, stooping, squatting), choose a position that allows you to rest in a less flexed posture. You would want to avoid curling into a fetal position on your side. If your pain worsens with extension-based activities (prolonged standing, working overhead), avoid resting in an extended position such as sleeping on your stomach. Most people will find more comfort when they rest with their spine in a more neutral position, neither too rounded nor too   arched. Lying on a non-sagging bed on your side with a pillow between your knees, or on your back with a pillow under your knees will often provide some relief. Keep in mind, being in any one position for a prolonged period of time, no matter how correct your posture, can still lead to stiffness. PROPER SITTING POSTURE In order to minimize stress and discomfort on your spine, you must sit with correct posture Sitting with good posture should be effortless for a healthy body. Returning to good posture is a gradual process. Many people can work toward this most comfortably by using various supports until they have the flexibility and strength to maintain this posture on their own. When sitting with proper posture, your ears will fall over your shoulders and your shoulders will fall over your hips. You should use the back of the chair to support your upper back. Your low back will be in a neutral position, just slightly arched. You may place a small pillow or folded towel at the base of your low back for support.  When  working at a desk, create an environment that supports good, upright posture. Without extra support, muscles fatigue and lead to excessive strain on joints and other tissues. Keep these recommendations in mind: CHAIR:   A chair should be able to slide under your desk when your back makes contact with the back of the chair. This allows you to work closely.  The chair's height should allow your eyes to be level with the upper part of your monitor and your hands to be slightly lower than your elbows. BODY POSITION  Your feet should make contact with the floor. If this is not possible, use a foot rest.  Keep your ears over your shoulders. This will reduce stress on your neck and low back. INCORRECT SITTING POSTURES   If you are feeling tired and unable to assume a healthy sitting posture, do not slouch or slump. This puts excessive strain on your back tissues, causing more damage and pain. Healthier options include:  Using more support, like a lumbar pillow.  Switching tasks to something that requires you to be upright or walking.  Talking a brief walk.  Lying down to rest in a neutral-spine position. PROLONGED STANDING WHILE SLIGHTLY LEANING FORWARD  When completing a task that requires you to lean forward while standing in one place for a long time, place either foot up on a stationary 2-4 inch high object to help maintain the [place] posture. When both feet are on the ground, the low back tends to lose its slight inward curve. If this curve flattens (or becomes too large), then the back and your other joints will experience too much stress, fatigue more quickly and can cause pain.  CORRECT STANDING POSTURES Proper standing posture should be assumed with all daily activities, even if they only take a few moments, like when brushing your teeth. As in sitting, your ears should fall over your shoulders and your shoulders should fall over your hips. You should keep a slight tension in your abdominal  muscles to brace your spine. Your tailbone should point down to the ground, not behind your body, resulting in an over-extended swayback posture.  INCORRECT STANDING POSTURES  Common incorrect standing postures include a forward head, locked knees and/or an excessive swayback. WALKING Walk with an upright posture. Your ears, shoulders and hips should all line-up. PROLONGED ACTIVITY IN A FLEXED POSITION When completing a task that requires you to bend forward   at your waist or lean over a low surface, try to find a way to stabilize 3 of 4 of your limbs. You can place a hand or elbow on your thigh or rest a knee on the surface you are reaching across. This will provide you more stability so that your muscles do not fatigue as quickly. By keeping your knees relaxed, or slightly bent, you will also reduce stress across your low back. CORRECT LIFTING TECHNIQUES DO :   Assume a wide stance. This will provide you more stability and the opportunity to get as close as possible to the object which you are lifting.  Tense your abdominals to brace your spine; then bend at the knees and hips. Keeping your back locked in a neutral-spine position, lift using your leg muscles. Lift with your legs, keeping your back straight.  Test the weight of unknown objects before attempting to lift them.  Try to keep your elbows locked down at your sides in order get the [place] strength from your shoulders when carrying an object.  Always ask for help when lifting heavy or awkward objects. INCORRECT LIFTING TECHNIQUES DO NOT:   Lock your knees when lifting, even if it is a small object.  Bend and twist. Pivot at your feet or move your feet when needing to change directions.  Assume that you cannot safely pick up a paperclip without proper posture. Document Released: 04/29/2005 Document Revised: 09/13/2013 Document Reviewed: 08/11/2008 [employer] Patient Information 2015 [employer]. This information is not intended to  replace advice given to you by your health care provider. Make sure you discuss any questions you have with your health care provider.  

## 2014-02-02 NOTE — Progress Notes (Signed)
Chief Complaint:  Chief Complaint  Patient presents with  . Back Pain    lower back but the right side hurts more and it shoots down the buttocks     HPI: Brianna BLAZEJEWSKI is a 43 y.o. female who is here for acute  low back pain that started 2 days ago with right sided sciatica going to posterior thigh and stops at knee. She has constant pain and tingling and sharp shooting pain 8/10 pain.She is going to [employer] [place] for business, she is flying . She will be in heels and needs to get better. She is going for conference for alumni; she is Public house manager at [employer]. She ahs tried naproxen without relief.   Past Medical History  Diagnosis Date  . GERD (gastroesophageal reflux disease)     no meds  . Complication of anesthesia     pt hallucinated after CS 14 yrs ago- ? med induced   Past Surgical History  Procedure Laterality Date  . Cesarean section      x2  . Laparoscopic assisted vaginal hysterectomy  07/22/2011    Procedure: LAPAROSCOPIC ASSISTED VAGINAL HYSTERECTOMY;  Surgeon: Marylynn Pearson, MD;  Location: [facility];  Service: Gynecology;  Laterality: N/A;  Laparotomy Abdominal hysterectomy   History   Social History  . Marital Status: Legally Separated    Spouse Name: N/A    Number of Children: N/A  . Years of Education: N/A   Social History Main Topics  . Smoking status: Never Smoker   . Smokeless tobacco: None  . Alcohol Use: 0.6 oz/week    1 Cans of beer per week  . Drug Use: No  . Sexual Activity:    Other Topics Concern  . None   Social History Narrative  . None   History reviewed. No pertinent family history. No Known Allergies Prior to Admission medications   Medication Sig Start Date End Date Taking? Authorizing Provider  levothyroxine (SYNTHROID, LEVOTHROID) 50 MCG tablet Take 50 mcg by mouth daily before breakfast.   Yes Historical Provider, MD  OVER THE COUNTER MEDICATION Take 1 tablet by mouth every morning. Pt takes Ultimate-10  probiotic 300mg    Yes Historical Provider, MD     ROS: The patient denies fevers, chills, night sweats, unintentional weight loss, chest pain, palpitations, wheezing, dyspnea on exertion, nausea, vomiting, abdominal pain, dysuria, hematuria, melena, + numbness, weakness, or tingling.   All other systems have been reviewed and were otherwise negative with the exception of those mentioned in the HPI and as above.    PHYSICAL EXAM: Filed Vitals:   02/02/14 0926  BP: 120/70  Pulse: 93  Temp: 98.3 F (36.8 C)  Resp: 16   Filed Vitals:   02/02/14 0926  Height: 5' 6.5" (1.689 m)  Weight: 180 lb (81.647 kg)   Body mass index is 28.62 kg/(m^2).  General: Alert, no acute distress HEENT:  Normocephalic, atraumatic, oropharynx patent. EOMI, PERRLA Cardiovascular:  Regular rate and rhythm, no rubs murmurs or gallops.  No Carotid bruits, radial pulse intact. No pedal edema.  Respiratory: Clear to auscultation bilaterally.  No wheezes, rales, or rhonchi.  No cyanosis, no use of accessory musculature GI: No organomegaly, abdomen is soft and non-tender, positive bowel sounds.  No masses. Skin: No rashes. Neurologic: Facial musculature symmetric. Psychiatric: Patient is appropriate throughout our interaction. Lymphatic: No cervical lymphadenopathy Musculoskeletal: Gait intact. + paramsk tenderness  Decrease ROM 5/5 strength, 2/2 DTRs No saddle anesthesia Straight leg + on left  and right Hip and knee exam--normal   LABS:    EKG/XRAY:   Primary read interpreted by Dr. Marin Comment at [facility]. Neg for fx or dislocation   ASSESSMENT/PLAN: Encounter Diagnoses  Name Primary?  . Right-sided low back pain with right-sided sciatica Yes  . Sprain and strain    Rx medrol dose pack Rx roxicet, flexeril, she can take mobic after medrol dose pack is done F/u prn  Gross sideeffects, risk and benefits, and alternatives of medications d/w patient. Patient is aware that all medications have potential  sideeffects and we are unable to predict every sideeffect or drug-drug interaction that may occur.  Makylie Rivere, [place], DO 02/02/2014 11:26 AM

## 2014-02-25 ENCOUNTER — Other Ambulatory Visit (HOSPITAL_COMMUNITY): Payer: Self-pay | Admitting: Endocrinology

## 2014-02-25 DIAGNOSIS — E049 Nontoxic goiter, unspecified: Secondary | ICD-10-CM

## 2014-03-07 ENCOUNTER — Ambulatory Visit (HOSPITAL_COMMUNITY): Payer: BC Managed Care – PPO

## 2014-03-14 ENCOUNTER — Ambulatory Visit (HOSPITAL_COMMUNITY)
Admission: RE | Admit: 2014-03-14 | Discharge: 2014-03-14 | Disposition: A | Discharge status: Home / Self Care | Payer: BC Managed Care – PPO | Source: Ambulatory Visit | Attending: Interventional Radiology | Admitting: Interventional Radiology

## 2014-03-14 DIAGNOSIS — E042 Nontoxic multinodular goiter: Secondary | ICD-10-CM | POA: Diagnosis not present

## 2014-03-14 DIAGNOSIS — E049 Nontoxic goiter, unspecified: Secondary | ICD-10-CM

## 2014-07-13 ENCOUNTER — Other Ambulatory Visit (HOSPITAL_COMMUNITY): Payer: Self-pay | Admitting: Endocrinology

## 2014-07-13 DIAGNOSIS — R221 Localized swelling, mass and lump, neck: Secondary | ICD-10-CM

## 2014-07-13 DIAGNOSIS — E049 Nontoxic goiter, unspecified: Secondary | ICD-10-CM

## 2014-07-18 ENCOUNTER — Ambulatory Visit (HOSPITAL_COMMUNITY): Payer: BC Managed Care – PPO

## 2014-07-19 ENCOUNTER — Ambulatory Visit (HOSPITAL_COMMUNITY): Payer: BC Managed Care – PPO

## 2014-07-19 ENCOUNTER — Ambulatory Visit (HOSPITAL_COMMUNITY)
Admission: RE | Admit: 2014-07-19 | Discharge: 2014-07-19 | Disposition: A | Discharge status: Home / Self Care | Payer: BC Managed Care – PPO | Source: Ambulatory Visit | Attending: Endocrinology | Admitting: Endocrinology

## 2014-07-19 DIAGNOSIS — E042 Nontoxic multinodular goiter: Secondary | ICD-10-CM | POA: Insufficient documentation

## 2014-07-19 DIAGNOSIS — R221 Localized swelling, mass and lump, neck: Secondary | ICD-10-CM

## 2014-07-19 DIAGNOSIS — E041 Nontoxic single thyroid nodule: Secondary | ICD-10-CM | POA: Diagnosis present

## 2014-07-19 DIAGNOSIS — E049 Nontoxic goiter, unspecified: Secondary | ICD-10-CM

## 2014-08-01 ENCOUNTER — Other Ambulatory Visit: Payer: Self-pay | Admitting: Endocrinology

## 2014-08-01 DIAGNOSIS — R4702 Dysphasia: Secondary | ICD-10-CM

## 2014-08-23 ENCOUNTER — Ambulatory Visit
Admission: RE | Admit: 2014-08-23 | Discharge: 2014-08-23 | Disposition: A | Discharge status: Home / Self Care | Payer: BC Managed Care – PPO | Source: Ambulatory Visit | Attending: Endocrinology | Admitting: Endocrinology

## 2014-08-23 DIAGNOSIS — R4702 Dysphasia: Secondary | ICD-10-CM

## 2014-09-02 ENCOUNTER — Other Ambulatory Visit: Payer: Self-pay | Admitting: Obstetrics and Gynecology

## 2014-09-06 ENCOUNTER — Other Ambulatory Visit: Payer: Self-pay | Admitting: Obstetrics and Gynecology

## 2014-09-06 DIAGNOSIS — R928 Other abnormal and inconclusive findings on diagnostic imaging of breast: Secondary | ICD-10-CM

## 2014-09-07 LAB — CYTOLOGY - PAP

## 2014-09-13 ENCOUNTER — Ambulatory Visit
Admission: RE | Admit: 2014-09-13 | Discharge: 2014-09-13 | Disposition: A | Discharge status: Home / Self Care | Payer: BC Managed Care – PPO | Source: Ambulatory Visit | Attending: Obstetrics and Gynecology | Admitting: Obstetrics and Gynecology

## 2014-09-13 DIAGNOSIS — R928 Other abnormal and inconclusive findings on diagnostic imaging of breast: Secondary | ICD-10-CM

## 2014-11-24 ENCOUNTER — Ambulatory Visit (INDEPENDENT_AMBULATORY_CARE_PROVIDER_SITE_OTHER): Payer: BC Managed Care – PPO | Admitting: Physician Assistant

## 2014-11-24 VITALS — BP 122/74 | HR 95 | Temp 98.4°F | Resp 18 | Ht 66.0 in | Wt 186.2 lb

## 2014-11-24 DIAGNOSIS — R05 Cough: Secondary | ICD-10-CM | POA: Diagnosis not present

## 2014-11-24 DIAGNOSIS — J309 Allergic rhinitis, unspecified: Secondary | ICD-10-CM

## 2014-11-24 DIAGNOSIS — K219 Gastro-esophageal reflux disease without esophagitis: Secondary | ICD-10-CM | POA: Diagnosis not present

## 2014-11-24 DIAGNOSIS — R059 Cough, unspecified: Secondary | ICD-10-CM

## 2014-11-24 MED ORDER — BENZONATATE 100 MG PO CAPS: 100.0000 mg | ORAL_CAPSULE | Freq: Three times a day (TID) | ORAL | Status: DC | PRN

## 2014-11-24 MED ORDER — HYDROCODONE-HOMATROPINE 5-1.5 MG/5ML PO SYRP
5.0000 mL | ORAL_SOLUTION | Freq: Three times a day (TID) | ORAL | Status: DC | PRN
Start: 2014-11-24 — End: 2015-01-08

## 2014-11-24 NOTE — Progress Notes (Signed)
   Subjective:    Patient ID: Brianna Barton, female    DOB: 08-Mar-1971, 43 y.o.   MRN: [id number]  Chief Complaint  Patient presents with  . Cough    X Sunday  . Sore Throat    X Sunday   Medications, allergies, past medical history, surgical history, family history, social history and problem list reviewed and updated.  HPI  50 yof presents with 4 day h/o non prod cough and st.   Persistent past 4-5 days. Head/nasal congestion. Cough throughout day. Scratchy throat. Itchy/watery eyes. Feels like she might be wheezing. Denies fevers, chills.   No hx asthma. No hx seasonal allergies. Has hx acid reflux, has been having reflux sx but only taking zantac prn.   Review of Systems See HPI.     Objective:   Physical Exam  Constitutional: She appears well-developed and well-nourished.  Non-toxic appearance. She does not have a sickly appearance. She does not appear ill. No distress.  BP 122/74 mmHg  Pulse 95  Temp(Src) 98.4 F (36.9 C) (Oral)  Resp 18  Ht 5\' 6"  (1.676 m)  Wt 186 lb 3.2 oz (84.46 kg)  BMI 30.07 kg/m2  SpO2 98%  LMP 06/23/2011   HENT:  Right Ear: Tympanic membrane normal.  Left Ear: Tympanic membrane normal.  Nose: Mucosal edema and rhinorrhea present. Right sinus exhibits no maxillary sinus tenderness and no frontal sinus tenderness. Left sinus exhibits no maxillary sinus tenderness and no frontal sinus tenderness.  Mouth/Throat: Uvula is midline, oropharynx is clear and moist and mucous membranes are normal.  Pulmonary/Chest: Effort normal and breath sounds normal. No tachypnea.  Lymphadenopathy:       Head (right side): No submental, no submandibular and no tonsillar adenopathy present.       Head (left side): No submental, no submandibular and no tonsillar adenopathy present.    She has no cervical adenopathy.      Assessment & Plan:   Cough - Plan: benzonatate (TESSALON) 100 MG capsule, HYDROcodone-homatropine (HYCODAN) 5-1.5 MG/5ML  syrup  Allergic rhinitis, unspecified allergic rhinitis type  Gastroesophageal reflux disease, esophagitis presence not specified --doubt bacterial etiology with benign exam, normal vitals, no fevers --suspect allergic rhinitis/viral uri as cause with post nasal drip --zyrtec-d, flonase --tessalon/hycodan --let us know if not improving one week  Julieta Gutting, PA-C Physician Assistant-Certified [facility]  11/24/2014 1:07 PM

## 2014-11-24 NOTE — Patient Instructions (Signed)
I think your cough is most likely from head congestion leading to post nasal drip.  Take zyrtec-d or claritin-d daily for the allergies and the congestion.  Using flonase 2 sprays in each nostril twice daily will help with the allergies and congestion. For the cough, taking the tessalon during the day and hycodan at night will help.  Take your zantac twice daily for the next couple weeks.  Please let us know if you're not improving in one week.

## 2015-01-08 ENCOUNTER — Ambulatory Visit (INDEPENDENT_AMBULATORY_CARE_PROVIDER_SITE_OTHER): Payer: BC Managed Care – PPO | Admitting: Internal Medicine

## 2015-01-08 ENCOUNTER — Ambulatory Visit (HOSPITAL_COMMUNITY)
Admission: RE | Admit: 2015-01-08 | Discharge: 2015-01-08 | Disposition: A | Discharge status: Home / Self Care | Payer: BC Managed Care – PPO | Source: Ambulatory Visit | Attending: Internal Medicine | Admitting: Internal Medicine

## 2015-01-08 VITALS — BP 120/70 | HR 102 | Temp 98.1°F | Ht 66.0 in | Wt 181.5 lb

## 2015-01-08 DIAGNOSIS — H5319 Other subjective visual disturbances: Secondary | ICD-10-CM | POA: Diagnosis not present

## 2015-01-08 DIAGNOSIS — M542 Cervicalgia: Secondary | ICD-10-CM | POA: Diagnosis not present

## 2015-01-08 DIAGNOSIS — E039 Hypothyroidism, unspecified: Secondary | ICD-10-CM

## 2015-01-08 DIAGNOSIS — R42 Dizziness and giddiness: Secondary | ICD-10-CM | POA: Diagnosis not present

## 2015-01-08 DIAGNOSIS — H538 Other visual disturbances: Secondary | ICD-10-CM

## 2015-01-08 DIAGNOSIS — H5713 Ocular pain, bilateral: Secondary | ICD-10-CM

## 2015-01-08 DIAGNOSIS — R51 Headache: Secondary | ICD-10-CM | POA: Insufficient documentation

## 2015-01-08 DIAGNOSIS — H53143 Visual discomfort, bilateral: Secondary | ICD-10-CM

## 2015-01-08 DIAGNOSIS — R000 Tachycardia, unspecified: Secondary | ICD-10-CM | POA: Diagnosis not present

## 2015-01-08 DIAGNOSIS — R519 Headache, unspecified: Secondary | ICD-10-CM

## 2015-01-08 LAB — POCT CBC
Granulocyte percent: 73.8 %G (ref 37–80)
HCT, POC: 41 % (ref 37.7–47.9)
Hemoglobin: 12.6 g/dL (ref 12.2–16.2)
Lymph, poc: 1.7 (ref 0.6–3.4)
MCH: 25.6 pg — AB (ref 27–31.2)
MCHC: 30.8 g/dL — AB (ref 31.8–35.4)
MCV: 83.2 fL (ref 80–97)
MID (CBC): 0.5 (ref 0–0.9)
MPV: 7.7 fL (ref 0–99.8)
PLATELET COUNT, POC: 384 10*3/uL (ref 142–424)
POC Granulocyte: 6.4 (ref 2–6.9)
POC LYMPH PERCENT: 19.9 %L (ref 10–50)
POC MID %: 6.3 %M (ref 0–12)
RBC: 4.93 M/uL (ref 4.04–5.48)
RDW, POC: 13.8 %
WBC: 8.7 10*3/uL (ref 4.6–10.2)

## 2015-01-08 LAB — BASIC METABOLIC PANEL
BUN: 16 mg/dL (ref 7–25)
CHLORIDE: 106 mmol/L (ref 98–110)
CO2: 28 mmol/L (ref 20–31)
Calcium: 8.7 mg/dL (ref 8.6–10.2)
Creat: 0.89 mg/dL (ref 0.50–1.10)
Glucose, Bld: 86 mg/dL (ref 65–99)
POTASSIUM: 4 mmol/L (ref 3.5–5.3)
Sodium: 140 mmol/L (ref 135–146)

## 2015-01-08 LAB — POCT SEDIMENTATION RATE: POCT SED RATE: 23 mm/hr — AB (ref 0–22)

## 2015-01-08 MED ORDER — HYDROCODONE-ACETAMINOPHEN 5-325 MG PO TABS: 1.0000 | ORAL_TABLET | Freq: Four times a day (QID) | ORAL | Status: DC | PRN

## 2015-01-08 NOTE — Progress Notes (Signed)
Patient ID: Brianna Barton, female   DOB: Mar 14, 1971, 44 y.o.   MRN: [id number]   01/08/2015 at 3:18 PM  Brianna Barton / DOB: [birth year] / MRN: [id number]  Problem list reviewed and updated by me where necessary.   SUBJECTIVE  Brianna Barton is a 44 y.o. well appearing female presenting for the chief complaint of HA for 9days, this is not her usual migraine..  Also has painfull eyes with no vision change, pain caused by her contacts which is causing nasal congestion. She has no fever, no focal NMS loss or changes. She has rare migrains, [facility] but [facility]. Has photophobia but no N or V.Does have some neck ache but has full rom of neck. The HA is persistent daily but does not waken from sleep.   She  has a past medical history of GERD (gastroesophageal reflux disease); Complication of anesthesia; Migraine; and Thyroid disease.    Medications reviewed and updated by myself where necessary, and exist elsewhere in the encounter.   Brianna Barton has No Known Allergies. She  reports that she has never smoked. She does not have any smokeless tobacco history on file. She reports that she drinks about 0.6 oz of alcohol per week. She reports that she does not use illicit drugs. She  has no sexual activity history on file. The patient  has past surgical history that includes Cesarean section; Laparoscopic assisted vaginal hysterectomy (07/22/2011); and Abdominal hysterectomy.  Her family history is not on file.  Review of Systems  Constitutional: Negative for fever, chills, weight loss, malaise/fatigue and diaphoresis.  HENT: Positive for congestion. Negative for ear pain and sore throat.   Eyes: Positive for photophobia, pain and redness. Negative for blurred vision, double vision and discharge.  Respiratory: Negative for cough and shortness of breath.   Cardiovascular: Negative for chest pain and palpitations.  Gastrointestinal: Negative for nausea and  vomiting.  Musculoskeletal: Positive for neck pain.  Skin: Negative for rash.  Neurological: Positive for dizziness. Negative for tingling, tremors, sensory change, speech change, focal weakness, seizures, loss of consciousness, weakness and headaches.  Endo/Heme/Allergies: Negative.   Psychiatric/Behavioral: Negative.     OBJECTIVE  Her  height is 5\' 6"  (1.676 m) and weight is 181 lb 8 oz (82.328 kg). Her oral temperature is 98.1 F (36.7 C). Her blood pressure is 120/70 and her pulse is 102. Her oxygen saturation is 98%.  The patient's body mass index is 29.31 kg/(m^2).  Physical Exam  Constitutional: She is oriented to person, place, and time. She appears well-developed and well-nourished. No distress.  HENT:  Head: Normocephalic.  Right Ear: External ear normal.  Left Ear: External ear normal.  Nose: Mucosal edema and rhinorrhea present. No sinus tenderness. Right sinus exhibits no maxillary sinus tenderness and no frontal sinus tenderness. Left sinus exhibits no maxillary sinus tenderness and no frontal sinus tenderness.  Mouth/Throat: Oropharynx is clear and moist.  Cardiovascular: Regular rhythm, S1 normal, S2 normal and normal heart sounds.   No extrasystoles are present. Tachycardia present.  Exam reveals no gallop.   No murmur heard. Respiratory: Effort normal and breath sounds normal.  GI: Soft. There is no tenderness.  Musculoskeletal: Normal range of motion. She exhibits no tenderness.  Neurological: She is alert and oriented to person, place, and time. She has normal strength and normal reflexes. She displays normal reflexes. No cranial nerve deficit or sensory deficit. She exhibits normal muscle tone. She displays a negative Romberg  sign. Coordination and gait normal.  Balance excellent No drift F-N intact  Skin: No rash noted. She is not diaphoretic.  Vision 20/20 both with glasses  Results for orders placed or performed in visit on 01/08/15 (from the past 24 hour(s))   POCT CBC     Status: Abnormal   Collection Time: 01/08/15  2:54 PM  Result Value Ref Range   WBC 8.7 4.6 - 10.2 K/uL   Lymph, poc 1.7 0.6 - 3.4   POC LYMPH PERCENT 19.9 10 - 50 %L   MID (cbc) 0.5 0 - 0.9   POC MID % 6.3 0 - 12 %M   POC Granulocyte 6.4 2 - 6.9   Granulocyte percent 73.8 37 - 80 %G   RBC 4.93 4.04 - 5.48 M/uL   Hemoglobin 12.6 12.2 - 16.2 g/dL   HCT, POC 41.0 37.7 - 47.9 %   MCV 83.2 80 - 97 fL   MCH, POC 25.6 (A) 27 - 31.2 pg   MCHC 30.8 (A) 31.8 - 35.4 g/dL   RDW, POC 13.8 %   Platelet Count, POC 384 142 - 424 K/uL   MPV 7.7 0 - 99.8 fL   EKG sinus tach ASSESSMENT & PLAN See your eye doctor Monday if not 100% well. RTC tomorrow am CT no contrast tonite, call report   Brianna Barton was seen today for headache.  Diagnoses and all orders for this visit:  Tachycardia -     EKG 12-Lead -     POCT CBC -     POCT SEDIMENTATION RATE -     TSH -     T3, Free -     T4, Free -     Basic metabolic panel -     Rocky mtn spotted fvr ab, IgM-blood  Acute intractable headache, unspecified headache type -     EKG 12-Lead -     POCT CBC -     POCT SEDIMENTATION RATE -     TSH -     T3, Free -     T4, Free -     Basic metabolic panel -     Rocky mtn spotted fvr ab, IgM-blood -     CT Head Wo Contrast; Future -     HYDROcodone-acetaminophen (NORCO/VICODIN) 5-325 MG per tablet; Take 1 tablet by mouth every 6 (six) hours as needed.  Hypothyroidism, unspecified hypothyroidism type -     EKG 12-Lead -     POCT CBC -     POCT SEDIMENTATION RATE -     TSH -     T3, Free -     T4, Free -     Basic metabolic panel -     Rocky mtn spotted fvr ab, IgM-blood  Pain of both eyes -     EKG 12-Lead -     POCT CBC -     POCT SEDIMENTATION RATE -     TSH -     T3, Free -     T4, Free -     Basic metabolic panel -     Rocky mtn spotted fvr ab, IgM-blood -     HYDROcodone-acetaminophen (NORCO/VICODIN) 5-325 MG per tablet; Take 1 tablet by mouth every 6 (six) hours as  needed.  Photophobia of both eyes  Neck pain -     HYDROcodone-acetaminophen (NORCO/VICODIN) 5-325 MG per tablet; Take 1 tablet by mouth every 6 (six) hours as needed.  Blurred vision  Dizziness and giddiness

## 2015-01-08 NOTE — Patient Instructions (Addendum)
Go to [facility] for OUTPATIENT CT. You will need to register at the emergency department. DO NOT REGISTER AS ED PATIENT.   General Headache Without Cause A headache is pain or discomfort felt around the head or neck area. The specific cause of a headache may not be found. There are many causes and types of headaches. A few common ones are:  Tension headaches.  Migraine headaches.  Cluster headaches.  Chronic daily headaches. HOME CARE INSTRUCTIONS   Keep all follow-up appointments with your caregiver or any specialist referral.  Only take over-the-counter or prescription medicines for pain or discomfort as directed by your caregiver.  Lie down in a dark, quiet room when you have a headache.  Keep a headache journal to find out what may trigger your migraine headaches. For example, write down:  What you eat and drink.  How much sleep you get.  Any change to your diet or medicines.  Try massage or other relaxation techniques.  Put ice packs or heat on the head and neck. Use these 3 to 4 times per day for 15 to 20 minutes each time, or as needed.  Limit stress.  Sit up straight, and do not tense your muscles.  Quit smoking if you smoke.  Limit alcohol use.  Decrease the amount of caffeine you drink, or stop drinking caffeine.  Eat and sleep on a regular schedule.  Get 7 to 9 hours of sleep, or as recommended by your caregiver.  Keep lights dim if bright lights bother you and make your headaches worse. SEEK MEDICAL CARE IF:   You have problems with the medicines you were prescribed.  Your medicines are not working.  You have a change from the usual headache.  You have nausea or vomiting. SEEK IMMEDIATE MEDICAL CARE IF:   Your headache becomes severe.  You have a fever.  You have a stiff neck.  You have loss of vision.  You have muscular weakness or loss of muscle control.  You start losing your balance or have trouble walking.  You feel  faint or pass out.  You have severe symptoms that are different from your first symptoms. MAKE SURE YOU:   Understand these instructions.  Will watch your condition.  Will get help right away if you are not doing well or get worse. Document Released: 04/29/2005 Document Revised: 07/22/2011 Document Reviewed: 05/15/2011 [facility] Patient Information 2015 [place], [place]. This information is not intended to replace advice given to you by your health care provider. Make sure you discuss any questions you have with your health care provider.

## 2015-01-09 LAB — T4, FREE: Free T4: 0.98 ng/dL (ref 0.80–1.80)

## 2015-01-09 LAB — TSH: TSH: 0.616 u[IU]/mL (ref 0.350–4.500)

## 2015-01-09 LAB — T3, FREE: T3, Free: 2.5 pg/mL (ref 2.3–4.2)

## 2015-01-10 ENCOUNTER — Ambulatory Visit (INDEPENDENT_AMBULATORY_CARE_PROVIDER_SITE_OTHER): Payer: BC Managed Care – PPO | Admitting: Internal Medicine

## 2015-01-10 ENCOUNTER — Encounter: Payer: Self-pay | Admitting: Family Medicine

## 2015-01-10 VITALS — BP 118/68 | HR 111 | Temp 98.3°F | Resp 16 | Ht 66.75 in | Wt 179.4 lb

## 2015-01-10 DIAGNOSIS — G43711 Chronic migraine without aura, intractable, with status migrainosus: Secondary | ICD-10-CM | POA: Diagnosis not present

## 2015-01-10 LAB — ROCKY MTN SPOTTED FVR AB, IGM-BLOOD: ROCKY MTN SPOTTED FEVER, IGM: 0.19 IV

## 2015-01-10 NOTE — Progress Notes (Signed)
Patient ID: Brianna Barton, female   DOB: Oct 29, 1970, 44 y.o.   MRN: [id number]   01/10/2015 at 12:47 PM  Brianna Barton / DOB: 1970-06-25 / MRN: [id number]  Problem list reviewed and updated by me where necessary.   SUBJECTIVE  Brianna Barton is a 44 y.o. well appearing female presenting for the chief complaint of probable migraine persistent.    She  has a past medical history of GERD (gastroesophageal reflux disease); Complication of anesthesia; Migraine; and Thyroid disease.    Medications reviewed and updated by myself where necessary, and exist elsewhere in the encounter.   Brianna Barton has No Known Allergies. She  reports that she has never smoked. She does not have any smokeless tobacco history on file. She reports that she drinks about 0.6 oz of alcohol per week. She reports that she does not use illicit drugs. She  has no sexual activity history on file. The patient  has past surgical history that includes Cesarean section; Laparoscopic assisted vaginal hysterectomy (07/22/2011); and Abdominal hysterectomy.  Her family history is not on file.  Review of Systems  Constitutional: Negative for fever.  Respiratory: Negative for shortness of breath.   Cardiovascular: Negative for chest pain.  Gastrointestinal: Negative for nausea.  Skin: Negative for rash.  Neurological: Negative for dizziness and headaches.    OBJECTIVE  Her  height is 5' 6.75" (1.695 m) and weight is 179 lb 6.4 oz (81.375 kg). Her oral temperature is 98.3 F (36.8 C). Her blood pressure is 118/68 and her pulse is 111. Her respiration is 16 and oxygen saturation is 97%.  The patient's body mass index is 28.32 kg/(m^2).  Physical Exam  Constitutional: She is oriented to person, place, and time. She appears well-developed and well-nourished.  HENT:  Head: Normocephalic.  Eyes: EOM are normal. Pupils are equal, round, and reactive to light.  Neck: Normal range of motion.  Cardiovascular: Normal  rate.   Respiratory: Effort normal.  Neurological: She is alert and oriented to person, place, and time. She exhibits normal muscle tone. Coordination normal.  Psychiatric: She has a normal mood and affect. Her behavior is normal. Thought content normal.    No results found for this or any previous visit (from the past 24 hour(s)).  ASSESSMENT & PLAN  Brianna Barton was seen today for follow-up.  Diagnoses and all orders for this visit:  Intractable chronic migraine without aura and with status migrainosus   Consider referral to neurology Keep HA diary

## 2015-01-10 NOTE — Patient Instructions (Signed)

## 2015-11-27 ENCOUNTER — Ambulatory Visit (INDEPENDENT_AMBULATORY_CARE_PROVIDER_SITE_OTHER): Payer: BC Managed Care – PPO | Admitting: Physician Assistant

## 2015-11-27 ENCOUNTER — Telehealth: Payer: Self-pay

## 2015-11-27 VITALS — BP 120/84 | HR 92 | Temp 98.0°F | Resp 18 | Ht 66.75 in | Wt 186.0 lb

## 2015-11-27 DIAGNOSIS — M5431 Sciatica, right side: Secondary | ICD-10-CM | POA: Diagnosis not present

## 2015-11-27 MED ORDER — CYCLOBENZAPRINE HCL 10 MG PO TABS: 10.0000 mg | ORAL_TABLET | Freq: Every day | ORAL | Status: DC

## 2015-11-27 MED ORDER — PREDNISONE 20 MG PO TABS: ORAL_TABLET | ORAL | Status: DC

## 2015-11-27 NOTE — Telephone Encounter (Signed)
Pt is needing something called in for her sciatic nerve problem  [facility] number [phone]

## 2015-11-27 NOTE — Patient Instructions (Addendum)
Take prednisone taper as directed - 3 tabs for 3 days, 2 tabs for 3 days, 1 tab for 3 days.  Take flexeril as night. May take tylenol as needed for breakthrough pain. Heat, gentle massage and gentle stretching can help. Remain active, as inactivity can cause more pain. Don't do anything too strenuous though. If you develop new numbness, weakness or incontinence of urine or bowel, return to clinic or go to the emergency room ASAP. Return if your symptoms are not improving in 10-14 days or at any time if symptoms worsen.     IF you received an x-ray today, you will receive an invoice from [facility]. Please contact [facility] at [phone] with questions or concerns regarding your invoice.   IF you received labwork today, you will receive an invoice from [employer]. Please contact [facility] at [phone] with questions or concerns regarding your invoice.   Our billing staff will not be able to assist you with questions regarding bills from these companies.  You will be contacted with the lab results as soon as they are available. The fastest way to get your results is to activate your My Chart account. Instructions are located on the last page of this paperwork. If you have not heard from [place] regarding the results in 2 weeks, please contact this office.

## 2015-11-27 NOTE — Telephone Encounter (Signed)
Spoke with pt and informed her she needs to RTC since she has not been in since 2016, transferred pt to make an appt.

## 2015-11-27 NOTE — Progress Notes (Signed)
[facility] [address] [place] [address] [phone]  Date:  11/27/2015   Name:  Brianna Barton   DOB:  03/04/1971   MRN:  [id number]  PCP:  Reginia Forts, MD    Chief Complaint: Back Pain   History of Present Illness:  This is a 45 y.o. female who is presenting with sciatic problems. Pain started 2 days ago. Becoming "unbearable". Shooting pain down posterior right thigh to mid-thigh. Having mild paresthesias in her thigh, only occ. No saddle paresthesias. No problems with bowel or bladder. Sometimes feels like she is going to fall when she first stands up. Otherwise, no other leg weakness. She has been trying ice and aleve, no help.  First had problems with sciatica 18 years ago when she was pregnant. This is her 2nd flare in sciatica this year but before that, last flare was in 2015. Was not seen for flare earlier this year, wasn't that bad. Lumbar radiograph in 2015 negative. In the past she has been prescribed flexeril and "a pain pill" but states the flexeril is what has helped the most. She has also been prescribed prednisone before and tolerated well. No hx DM. Last BMP 1 year ago normal.  Review of Systems:  Review of Systems See HPI  Patient Active Problem List   Diagnosis Date Noted  . Unspecified hypothyroidism 07/17/2013   Home meds: None   No Known Allergies  Past Surgical History  Procedure Laterality Date  . Cesarean section      x2  . Laparoscopic assisted vaginal hysterectomy  07/22/2011    Procedure: LAPAROSCOPIC ASSISTED VAGINAL HYSTERECTOMY;  Surgeon: Marylynn Pearson, MD;  Location: [facility];  Service: Gynecology;  Laterality: N/A;  Laparotomy Abdominal hysterectomy  . Abdominal hysterectomy      Social History  Substance Use Topics  . Smoking status: Never Smoker   . Smokeless tobacco: None  . Alcohol Use: 0.6 oz/week    1 Cans of beer per week    History reviewed. No pertinent family history.  Medication list  has been reviewed and updated.  Physical Examination:  Physical Exam  Constitutional: She is oriented to person, place, and time. She appears well-developed and well-nourished. No distress.  HENT:  Head: Normocephalic and atraumatic.  Right Ear: Hearing normal.  Left Ear: Hearing normal.  Nose: Nose normal.  Eyes: Conjunctivae and lids are normal. Right eye exhibits no discharge. Left eye exhibits no discharge. No scleral icterus.  Cardiovascular: Normal rate, regular rhythm, normal heart sounds and normal pulses.   No murmur heard. Pulmonary/Chest: Effort normal and breath sounds normal. No respiratory distress. She has no wheezes. She has no rhonchi. She has no rales.  Abdominal: Soft. Normal appearance. There is no tenderness.  Musculoskeletal: Normal range of motion.       Lumbar back: She exhibits tenderness (mild, right paraspinal). She exhibits normal range of motion and no bony tenderness.  SLR positive on the right No sensation deficit LE strength 5/5 bilaterally   Neurological: She is alert and oriented to person, place, and time. She has normal reflexes.  Skin: Skin is warm, dry and intact. No lesion and no rash noted.  Psychiatric: She has a normal mood and affect. Her speech is normal and behavior is normal. Thought content normal.   BP 120/84 mmHg  Pulse 92  Temp(Src) 98 F (36.7 C) (Oral)  Resp 18  Ht 5' 6.75" (1.695 m)  Wt 186 lb (84.369 kg)  BMI 29.37 kg/m2  SpO2 99%  LMP 06/23/2011  Assessment and Plan:  1. Sciatica of right side Radiograph not indicated today. Neuro exam normal. SLR positive on the right. Treat with prednisone taper and flexeril. Counseled on heat, gentle stretching and gentle massage. Return in 10-14 days if symptoms do not improve or at any time if symptoms worsen.  - cyclobenzaprine (FLEXERIL) 10 MG tablet; Take 1 tablet (10 mg total) by mouth at bedtime.  Dispense: 30 tablet; Refill: 0 - predniSONE (DELTASONE) 20 MG tablet; Take 3 PO  QAM x3days, 2 PO QAM x3days, 1 PO QAM x3days  Dispense: 18 tablet; Refill: 0   Benjaman Pott. Drenda Freeze, MHS [facility]  11/27/2015

## 2016-05-01 ENCOUNTER — Ambulatory Visit (INDEPENDENT_AMBULATORY_CARE_PROVIDER_SITE_OTHER): Payer: BC Managed Care – PPO | Admitting: Physician Assistant

## 2016-05-01 ENCOUNTER — Encounter: Payer: Self-pay | Admitting: Physician Assistant

## 2016-05-01 VITALS — BP 130/78 | HR 103 | Temp 98.8°F | Resp 17 | Ht 66.75 in | Wt 199.0 lb

## 2016-05-01 DIAGNOSIS — Z1331 Encounter for screening for depression: Secondary | ICD-10-CM

## 2016-05-01 DIAGNOSIS — Z1389 Encounter for screening for other disorder: Secondary | ICD-10-CM

## 2016-05-01 DIAGNOSIS — S63602A Unspecified sprain of left thumb, initial encounter: Secondary | ICD-10-CM

## 2016-05-01 DIAGNOSIS — M79645 Pain in left finger(s): Secondary | ICD-10-CM

## 2016-05-01 MED ORDER — MELOXICAM 7.5 MG PO TABS: 7.5000 mg | ORAL_TABLET | Freq: Every day | ORAL | 1 refills | Status: DC

## 2016-05-01 NOTE — Progress Notes (Signed)
   Brianna Barton  MRN: [id number] DOB: 1970-05-23  PCP: Reginia Forts, MD  Subjective:  Pt is a left-handed 45 year old female who presents to clinic for sprained left thumb x five days. She fell onto her couch with an outstretched hand and felt pain at her thumb. Hurts worse with gripping and extending. + some swelling and bruising. No history of thumb injury. Denies numbness, tingling, weakness, wound. Of note, pt is experiencing increased life stressors, as she is about to lose her job.   Review of Systems  Constitutional: Negative for chills, diaphoresis, fatigue and fever.  Respiratory: Negative for cough, chest tightness, shortness of breath and wheezing.   Cardiovascular: Negative for chest pain and palpitations.  Gastrointestinal: Negative for abdominal pain, constipation, diarrhea, nausea and vomiting.  Musculoskeletal: Positive for arthralgias and joint swelling. Negative for myalgias.  Skin: Positive for color change. Negative for pallor, rash and wound.  Neurological: Negative for dizziness, syncope, weakness, light-headedness and numbness.  Psychiatric/Behavioral: Positive for dysphoric mood and sleep disturbance. The patient is nervous/anxious.     Patient Active Problem List   Diagnosis Date Noted  . Unspecified hypothyroidism 07/17/2013    Current Outpatient Prescriptions on File Prior to Visit  Medication Sig Dispense Refill  . [DISCONTINUED] diphenhydrAMINE (BENADRYL) 25 MG tablet Take 50 mg by mouth at bedtime as needed. For sleep    . [DISCONTINUED] norethindrone-ethinyl estradiol (MICROGESTIN,JUNEL,LOESTRIN) 1-20 MG-MCG tablet Take 1 tablet by mouth daily. Pt will stop after procedure on 07/22/11     No current facility-administered medications on file prior to visit.     No Known Allergies   Objective:  BP 130/78 (BP Location: Left Arm, Patient Position: Sitting, Cuff Size: Large)   Pulse (!) 103   Temp 98.8 F (37.1 C) (Oral)   Resp 17   Ht 5'  6.75" (1.695 m)   Wt 199 lb (90.3 kg)   LMP 06/23/2011   SpO2 99%   BMI 31.40 kg/m   Physical Exam  Constitutional: She is oriented to person, place, and time and well-developed, well-nourished, and in no distress. No distress.  Cardiovascular: Normal rate, regular rhythm and normal heart sounds.   Musculoskeletal:       Hands: Neurological: She is alert and oriented to person, place, and time. GCS score is 15.  Skin: Skin is warm and dry.  Psychiatric: Mood, memory, affect and judgment normal.  Vitals reviewed.   Assessment and Plan :  1. Sprain of left thumb, unspecified site of finger, initial encounter 2. Thumb pain, left - meloxicam (MOBIC) 7.5 MG tablet; Take 1 tablet (7.5 mg total) by mouth daily.  Dispense: 30 tablet; Refill: 1 - Advised pt to purchase a thumb spica. Wear for three weeks. After which gentle passive and active range of motion exercises may be performed out of the splint, along with pinch and handgrip exercises. RTC if no improvement.  3. Positive depression screening.  - Discussed with pt resources and possible medication treatments. Encouraged her to schedule appt soon to discuss. She understands and agrees.   Mercer Pod, PA-C  [facility] 05/01/2016 12:17 PM

## 2016-05-01 NOTE — Patient Instructions (Addendum)
Please go to [facility] [address], [place], [place] [address], [phone] For a THUMB SPICA.   The thumb spica should be worn at least three weeks, after which gentle passive and active range of motion exercises may be performed out of the splint, along with pinch and handgrip exercises. Follow-up if you are not improving after this treatment schedule.   Use ice for pain. Mobic: I prescribed you 7.5 mg. If this dose is not working, you can take 22m/day. It does not have to be two pills at once - you can take one in the morning and one at night. Play around with dosing and see what works [place] for you.   Thumb Sprain A thumb sprain is an injury to one of the strong bands of tissue (ligaments) that connect the bones in your thumb. The ligament can be stretched too much or it can tear. A tear can be either partial or complete. The severity of the sprain depends on how much of the ligament was damaged or torn. What are the causes? A thumb sprain is often caused by a fall or an accident. If you extend your hands to catch an object or to protect yourself, the force of the impact can cause your ligament to stretch too much. This excess tension can also cause your ligament to tear. What increases the risk? This injury is more likely to occur in people who play:  Sports that involve a greater risk of falling, such as skiing.  Sports that involve catching an object, such as basketball. What are the signs or symptoms? Symptoms of this condition include:  Loss of motion in your thumb.  Bruising.  Tenderness.  Swelling. How is this diagnosed? This condition is diagnosed with a medical history and physical exam. You may also have an X-ray of your thumb. How is this treated? Treatment varies depending on the severity of your sprain. If your ligament is overstretched or partially torn, treatment usually involves keeping your thumb in a fixed position (immobilization) for a period of  time. To help you do this, your health care provider will apply a bandage, cast, or splint to keep your thumb from moving until it heals. If your ligament is fully torn, you may need surgery to reconnect the ligament to the bone. After surgery, a cast or splint will be applied and will need to stay on your thumb while it heals. Your health care provider may also suggest exercises or physical therapy to strengthen your thumb. Follow these instructions at home: If you have a cast:  Do not stick anything inside the cast to scratch your skin. Doing that increases your risk of infection.  Check the skin around the cast every day. Report any concerns to your health care provider. You may put lotion on dry skin around the edges of the cast. Do not apply lotion to the skin underneath the cast.  Keep the cast clean and dry. If you have a splint:  Wear it as directed by your health care provider. Remove it only as directed by your health care provider.  Loosen the splint if your fingers become numb and tingle, or if they turn cold and blue.  Keep the splint clean and dry. Bathing  Cover the bandage, cast, or splint with a watertight plastic bag to protect it from water while you take a bath or a shower. Do not let the bandage, cast, or splint get wet. Managing pain, stiffness, and swelling  If  directed, apply ice to the injured area (unless you have a cast):  Put ice in a plastic bag.  Place a towel between your skin and the bag.  Leave the ice on for 20 minutes, 2-3 times per day.  Move your fingers often to avoid stiffness and to lessen swelling.  Raise (elevate) the injured area above the level of your heart while you are sitting or lying down. Driving  Do not drive or operate heavy machinery while taking pain medicine.  Do not drive while wearing a cast or splint on a hand that you use for driving. General instructions  Do not put pressure on any part of your cast or splint until  it is fully hardened. This may take several hours.  Take medicines only as directed by your health care provider. These include over-the-counter medicines and prescription medicines.  Keep all follow-up visits as directed by your health care provider. This is important.  Do any exercise or physical therapy as directed by your health care provider.  Do not wear rings on your injured thumb. Contact a health care provider if:  Your pain is not controlled with medicine.  Your bruising or swelling gets worse.  Your cast or splint is damaged. Get help right away if:  Your thumb is numb or blue.  Your thumb feels colder than normal. This information is not intended to replace advice given to you by your health care provider. Make sure you discuss any questions you have with your health care provider.  Thank you for coming in today. I hope you feel we met your needs.  Feel free to call [facility] if you have any questions or further requests.  Please consider signing up for MyChart if you do not already have it, as this is a great way to communicate with me.  [place],  Whitney McVey, PA-C  IF you received an x-ray today, you will receive an invoice from [facility]. Please contact [facility] at [phone] with questions or concerns regarding your invoice.   IF you received labwork today, you will receive an invoice from [place]. Please contact [facility] at [phone] with questions or concerns regarding your invoice.   Our billing staff will not be able to assist you with questions regarding bills from these companies.  You will be contacted with the lab results as soon as they are available. The fastest way to get your results is to activate your My Chart account. Instructions are located on the last page of this paperwork. If you have not heard from [place] regarding the results in 2 weeks, please contact this office.

## 2016-05-10 ENCOUNTER — Ambulatory Visit (INDEPENDENT_AMBULATORY_CARE_PROVIDER_SITE_OTHER): Payer: BC Managed Care – PPO | Admitting: Family Medicine

## 2016-05-10 VITALS — BP 120/72 | HR 110 | Temp 99.0°F | Resp 18 | Ht 66.75 in | Wt 200.0 lb

## 2016-05-10 DIAGNOSIS — B9789 Other viral agents as the cause of diseases classified elsewhere: Secondary | ICD-10-CM

## 2016-05-10 DIAGNOSIS — J069 Acute upper respiratory infection, unspecified: Secondary | ICD-10-CM | POA: Diagnosis not present

## 2016-05-10 MED ORDER — IPRATROPIUM BROMIDE 0.03 % NA SOLN: 2.0000 | Freq: Two times a day (BID) | NASAL | 0 refills | Status: DC

## 2016-05-10 MED ORDER — BENZONATATE 100 MG PO CAPS: 100.0000 mg | ORAL_CAPSULE | Freq: Three times a day (TID) | ORAL | 0 refills | Status: DC | PRN

## 2016-05-10 MED ORDER — HYDROCOD POLST-CPM POLST ER 10-8 MG/5ML PO SUER: 5.0000 mL | Freq: Two times a day (BID) | ORAL | 0 refills | Status: DC | PRN

## 2016-05-10 MED ORDER — MUCINEX DM MAXIMUM STRENGTH 60-1200 MG PO TB12: 1.0000 | ORAL_TABLET | Freq: Two times a day (BID) | ORAL | 1 refills | Status: DC

## 2016-05-10 NOTE — Patient Instructions (Addendum)
-   Ipratropium (Atrovent) 2 sprays, twice daily. -Take Tussionex 5 ml every 12 hours as needed for cough, at night only -Benzonatate 100-200 mg up to 3 times daily  IF you received an x-ray today, you will receive an invoice from [facility]. Please contact [facility] at [phone] with questions or concerns regarding your invoice.   IF you received labwork today, you will receive an invoice from [place]. Please contact [facility] at [phone] with questions or concerns regarding your invoice.   Our billing staff will not be able to assist you with questions regarding bills from these companies.  You will be contacted with the lab results as soon as they are available. The fastest way to get your results is to activate your My Chart account. Instructions are located on the last page of this paperwork. If you have not heard from [place] regarding the results in 2 weeks, please contact this office.     Upper Respiratory Infection, Adult Most upper respiratory infections (URIs) are caused by a virus. A URI affects the nose, throat, and upper air passages. The most common type of URI is often called "the common cold." Follow these instructions at home:  Take medicines only as told by your doctor.  Gargle warm saltwater or take cough drops to comfort your throat as told by your doctor.  Use a warm mist humidifier or inhale steam from a shower to increase air moisture. This may make it easier to breathe.  Drink enough fluid to keep your pee (urine) clear or pale yellow.  Eat soups and other clear broths.  Have a healthy diet.  Rest as needed.  Go back to work when your fever is gone or your doctor says it is okay.  You may need to stay home longer to avoid giving your URI to others.  You can also wear a face mask and wash your hands often to prevent spread of the virus.  Use your inhaler more if you have asthma.  Do not use any tobacco products, including  cigarettes, chewing tobacco, or electronic cigarettes. If you need help quitting, ask your doctor. Contact a doctor if:  You are getting worse, not better.  Your symptoms are not helped by medicine.  You have chills.  You are getting more short of breath.  You have brown or red mucus.  You have yellow or brown discharge from your nose.  You have pain in your face, especially when you bend forward.  You have a fever.  You have puffy (swollen) neck glands.  You have pain while swallowing.  You have white areas in the back of your throat. Get help right away if:  You have very bad or constant:  Headache.  Ear pain.  Pain in your forehead, behind your eyes, and over your cheekbones (sinus pain).  Chest pain.  You have long-lasting (chronic) lung disease and any of the following:  Wheezing.  Long-lasting cough.  Coughing up blood.  A change in your usual mucus.  You have a stiff neck.  You have changes in your:  Vision.  Hearing.  Thinking.  Mood. This information is not intended to replace advice given to you by your health care provider. Make sure you discuss any questions you have with your health care provider. Document Released: 10/16/2007 Document Revised: 12/31/2015 Document Reviewed: 08/04/2013 Elsevier Interactive Patient Education  2017 [employer].

## 2016-05-10 NOTE — Progress Notes (Signed)
Patient ID: Brianna Barton, female    DOB: 05/24/1970, 45 y.o.   MRN: [id number]  PCP: Reginia Forts, MD  Chief Complaint  Patient presents with  . Sore Throat  . CHEST CONGESTION    Subjective:  HPI 45 year old female presents for evaluation of sore throat and chest congestion x 4 days. Pt is a current, everyday smoker. Reports began cough x 4 days with intermittent production of phlegm. Chest rattles and feels heavy. Throat is sore from coughing. Persistent nasal congestion. Denies headache. No influenza shot this year. She has taken theraflu nighttime, dayquil and vitamin C.   Social History   Social History  . Marital status: Divorced    Spouse name: N/A  . Number of children: N/A  . Years of education: N/A   Occupational History  . Not on file.   Social History Main Topics  . Smoking status: Never Smoker  . Smokeless tobacco: Never Used  . Alcohol use 0.6 oz/week    1 Cans of beer per week  . Drug use: No  . Sexual activity: Not on file   Other Topics Concern  . Not on file   Social History Narrative  . No narrative on file   History reviewed. No pertinent family history. Review of Systems See HPI Patient Active Problem List   Diagnosis Date Noted  . Unspecified hypothyroidism 07/17/2013    No Known Allergies  Prior to Admission medications   Medication Sig Start Date End Date Taking? Authorizing Provider  meloxicam (MOBIC) 7.5 MG tablet Take 1 tablet (7.5 mg total) by mouth daily. 05/01/16  Yes Gelene Mink McVey, PA-C    Past Medical, Surgical Family and Social History reviewed and updated.    Objective:   Today's Vitals   05/10/16 1010  BP: 120/72  Pulse: (!) 110  Resp: 18  Temp: 99 F (37.2 C)  TempSrc: Oral  SpO2: 97%  Weight: 200 lb (90.7 kg)  Height: 5' 6.75" (1.695 m)    Wt Readings from Last 3 Encounters:  05/10/16 200 lb (90.7 kg)  05/01/16 199 lb (90.3 kg)  11/27/15 186 lb (84.4 kg)   Physical Exam    Constitutional: She is oriented to person, place, and time. She appears well-developed and well-nourished.  HENT:  Nose: Rhinorrhea present.  Mouth/Throat: Posterior oropharyngeal erythema present. No oropharyngeal exudate or posterior oropharyngeal edema.  Cardiovascular: Normal rate, regular rhythm, normal heart sounds and intact distal pulses.   Pulmonary/Chest: Effort normal and breath sounds normal.  Neurological: She is alert and oriented to person, place, and time.  Skin: Skin is warm and dry.  Psychiatric: She has a normal mood and affect. Her behavior is normal. Judgment and thought content normal.       Assessment & Plan:  1. Viral upper respiratory illness  . ipratropium (ATROVENT) 0.03 % nasal spray    Sig: Place 2 sprays into both nostrils 2 (two) times daily.    Dispense:  30 mL  . benzonatate (TESSALON) 100 MG capsule    Sig: Take 1-2 capsules (100-200 mg total) by mouth 3 (three) times daily as needed for cough.    Dispense:  40 capsule  . chlorpheniramine-HYDROcodone (TUSSIONEX PENNKINETIC ER) 10-8 MG/5ML SUER    Sig: Take 5 mLs by mouth every 12 (twelve) hours as needed for cough.    Dispense:  100 mL  . Dextromethorphan-Guaifenesin (MUCINEX DM MAXIMUM STRENGTH) 60-1200 MG TB12    Sig: Take 1 tablet by mouth every 12 (twelve)  hours.   Will consider antibiotic if symptoms not resolved by Tuesday.   Carroll Sage. Kenton Kingfisher, MSN, FNP-C [facility]

## 2016-05-23 ENCOUNTER — Ambulatory Visit (INDEPENDENT_AMBULATORY_CARE_PROVIDER_SITE_OTHER): Payer: Self-pay | Admitting: Family Medicine

## 2016-05-23 VITALS — BP 128/76 | HR 88 | Temp 98.1°F | Resp 18 | Ht 66.75 in | Wt 196.0 lb

## 2016-05-23 DIAGNOSIS — J329 Chronic sinusitis, unspecified: Secondary | ICD-10-CM

## 2016-05-23 MED ORDER — PSEUDOEPH-BROMPHEN-DM 30-2-10 MG/5ML PO SYRP: 5.0000 mL | ORAL_SOLUTION | Freq: Four times a day (QID) | ORAL | 0 refills | Status: DC | PRN

## 2016-05-23 MED ORDER — FLUCONAZOLE 150 MG PO TABS: 150.0000 mg | ORAL_TABLET | Freq: Once | ORAL | 0 refills | Status: AC

## 2016-05-23 MED ORDER — AMOXICILLIN-POT CLAVULANATE 875-125 MG PO TABS: 1.0000 | ORAL_TABLET | Freq: Two times a day (BID) | ORAL | 0 refills | Status: DC

## 2016-05-23 NOTE — Patient Instructions (Addendum)
Brompheniramine-pseudoephedrine-DM 30-2-10 MG/5ML -5-10 ml up to every 4 hour daily for cough.  Start Augmentin 1 tablet twice daily with food to avoid stomach upset. Complete all medication.  Take Diflucan 150 mg only  if vaginal irritation develops.   IF you received an x-ray today, you will receive an invoice from [facility]. Please contact [facility] at [phone] with questions or concerns regarding your invoice.   IF you received labwork today, you will receive an invoice from [place]. Please contact [facility] at [phone] with questions or concerns regarding your invoice.   Our billing staff will not be able to assist you with questions regarding bills from these companies.  You will be contacted with the lab results as soon as they are available. The fastest way to get your results is to activate your My Chart account. Instructions are located on the last page of this paperwork. If you have not heard from [place] regarding the results in 2 weeks, please contact this office.

## 2016-05-23 NOTE — Progress Notes (Signed)
Patient ID: Brianna Barton, female    DOB: 1971/03/19, 46 y.o.   MRN: [id number]  PCP: Reginia Forts, MD  Chief Complaint  Patient presents with  . Follow-up    URI    Subjective:  HPI 46 year old female presents for evaluation of upper respiratory infection . Still feeling persistent nasal congestion and cough. Patient was originally seen in the office on 05/10/16 for upper respiratory illness symptoms. During that visit, she was treated symptomatically. Today she reports continued and worsening cough with sore throat. Denies ear pain or tenderness, chest tenderness or fever. Reports facial tenderness and heaviness. She has taken benzonatate, Mucinex, Tussionex, and ipratropium nasal spray without relief of symptoms.  Social History   Social History  . Marital status: Divorced    Spouse name: N/A  . Number of children: N/A  . Years of education: N/A   Occupational History  . Not on file.   Social History Main Topics  . Smoking status: Never Smoker  . Smokeless tobacco: Never Used  . Alcohol use 0.6 oz/week    1 Cans of beer per week  . Drug use: No  . Sexual activity: Not on file   Other Topics Concern  . Not on file   Social History Narrative  . No narrative on file    No family history on file. Review of Systems See HPI Patient Active Problem List   Diagnosis Date Noted  . Unspecified hypothyroidism 07/17/2013    No Known Allergies  Prior to Admission medications   Medication Sig Start Date End Date Taking? Authorizing Provider  benzonatate (TESSALON) 100 MG capsule Take 1-2 capsules (100-200 mg total) by mouth 3 (three) times daily as needed for cough. 05/10/16  Yes Sedalia Muta, FNP  chlorpheniramine-HYDROcodone [facility] ER) 10-8 MG/5ML SUER Take 5 mLs by mouth every 12 (twelve) hours as needed for cough. Patient not taking: Reported on 05/23/2016 05/10/16   Sedalia Muta, FNP  Dextromethorphan-Guaifenesin  [facility] DM MAXIMUM STRENGTH) 60-1200 MG TB12 Take 1 tablet by mouth every 12 (twelve) hours. Patient not taking: Reported on 05/23/2016 05/10/16   Sedalia Muta, FNP  ipratropium (ATROVENT) 0.03 % nasal spray Place 2 sprays into both nostrils 2 (two) times daily. Patient not taking: Reported on 05/23/2016 05/10/16   Sedalia Muta, FNP  meloxicam (MOBIC) 7.5 MG tablet Take 1 tablet (7.5 mg total) by mouth daily. Patient not taking: Reported on 05/23/2016 05/01/16   Gelene Mink McVey, PA-C    Past Medical, Surgical Family and Social History reviewed and updated.    Objective:   Today's Vitals   05/23/16 1626  BP: 128/76  Pulse: 88  Resp: 18  Temp: 98.1 F (36.7 C)  TempSrc: Oral  SpO2: 99%  Weight: 196 lb (88.9 kg)  Height: 5' 6.75" (1.695 m)    Wt Readings from Last 3 Encounters:  05/23/16 196 lb (88.9 kg)  05/10/16 200 lb (90.7 kg)  05/01/16 199 lb (90.3 kg)   Physical Exam  Constitutional: She is oriented to person, place, and time.  HENT:  Right Ear: Hearing, tympanic membrane, external ear and ear canal normal.  Left Ear: Hearing, tympanic membrane, external ear and ear canal normal.  Nose: Mucosal edema and rhinorrhea present.  Mouth/Throat: Uvula is midline and mucous membranes are normal. No oropharyngeal exudate, posterior oropharyngeal edema, posterior oropharyngeal erythema or tonsillar abscesses.  Cardiovascular: Normal rate, regular rhythm, normal heart sounds and intact distal pulses.   Pulmonary/Chest: Effort normal and  breath sounds normal.  Musculoskeletal: Normal range of motion.  Neurological: She is alert and oriented to person, place, and time.  Skin: Skin is warm and dry.  Psychiatric: She has a normal mood and affect. Her behavior is normal. Judgment and thought content normal.      Assessment & Plan:  1. Sinusitis, unspecified chronicity, unspecified location  Plan:  . amoxicillin-clavulanate (AUGMENTIN) 875-125 MG  tablet    Sig: Take 1 tablet by mouth 2 (two) times daily.    Dispense:  20 tablet  . brompheniramine-pseudoephedrine-DM 30-2-10 MG/5ML syrup    Sig: Take 5 mLs by mouth 4 (four) times daily as needed.    Dispense:  120 mL  . fluconazole (DIFLUCAN) 150 MG tablet    Sig: Take 1 tablet (150 mg total) by mouth once. Repeat if needed    Dispense:  2 tablet   Return for follow-up as needed.  Carroll Sage. Kenton Kingfisher, MSN, FNP-C [facility]

## 2016-06-11 ENCOUNTER — Ambulatory Visit (INDEPENDENT_AMBULATORY_CARE_PROVIDER_SITE_OTHER): Payer: Self-pay | Admitting: Physician Assistant

## 2016-06-11 VITALS — BP 122/84 | HR 112 | Temp 98.2°F | Ht 66.75 in | Wt 193.8 lb

## 2016-06-11 DIAGNOSIS — R350 Frequency of micturition: Secondary | ICD-10-CM

## 2016-06-11 DIAGNOSIS — N3001 Acute cystitis with hematuria: Secondary | ICD-10-CM

## 2016-06-11 LAB — POCT URINALYSIS DIP (MANUAL ENTRY)
Bilirubin, UA: NEGATIVE
Glucose, UA: NEGATIVE
Ketones, POC UA: NEGATIVE
NITRITE UA: POSITIVE — AB
Protein Ur, POC: 30 — AB
Spec Grav, UA: 1.02
Urobilinogen, UA: 0.2
pH, UA: 5.5

## 2016-06-11 LAB — POC MICROSCOPIC URINALYSIS (UMFC): Mucus: ABSENT

## 2016-06-11 MED ORDER — NITROFURANTOIN MONOHYD MACRO 100 MG PO CAPS: 100.0000 mg | ORAL_CAPSULE | Freq: Two times a day (BID) | ORAL | 0 refills | Status: AC

## 2016-06-11 NOTE — Patient Instructions (Signed)
     IF you received an x-ray today, you will receive an invoice from [facility]. Please contact [facility] at [phone] with questions or concerns regarding your invoice.   IF you received labwork today, you will receive an invoice from [facility]. Please contact [facility] at [phone] with questions or concerns regarding your invoice.   Our billing staff will not be able to assist you with questions regarding bills from these companies.  You will be contacted with the lab results as soon as they are available. The fastest way to get your results is to activate your My Chart account. Instructions are located on the last page of this paperwork. If you have not heard from us regarding the results in 2 weeks, please contact this office.     

## 2016-06-11 NOTE — Progress Notes (Signed)
   Brianna Barton  MRN: [id number] DOB: 02-13-1971  Subjective:  Pt presents to clinic with with concerns that she has a UTI.  It has been years ssince she has had one.    Review of Systems  Constitutional: Negative for chills and fever.  Gastrointestinal: Positive for nausea. Negative for abdominal pain.  Genitourinary: Positive for dysuria, frequency, hematuria and urgency. Negative for vaginal discharge.  Musculoskeletal: Negative for back pain.    Patient Active Problem List   Diagnosis Date Noted  . Unspecified hypothyroidism 07/17/2013    Current Outpatient Prescriptions on File Prior to Visit  Medication Sig Dispense Refill  . [DISCONTINUED] diphenhydrAMINE (BENADRYL) 25 MG tablet Take 50 mg by mouth at bedtime as needed. For sleep    . [DISCONTINUED] norethindrone-ethinyl estradiol (MICROGESTIN,JUNEL,LOESTRIN) 1-20 MG-MCG tablet Take 1 tablet by mouth daily. Pt will stop after procedure on 07/22/11     No current facility-administered medications on file prior to visit.     No Known Allergies  Pt patients past, family and social history were reviewed and updated.   Objective:  BP 122/84   Pulse (!) 112   Temp 98.2 F (36.8 C) (Oral)   Ht 5' 6.75" (1.695 m)   Wt 193 lb 12.8 oz (87.9 kg)   LMP 06/23/2011   SpO2 98%   BMI 30.58 kg/m   Physical Exam  Constitutional: She is oriented to person, place, and time and well-developed, well-nourished, and in no distress.  HENT:  Head: Normocephalic and atraumatic.  Right Ear: External ear normal.  Left Ear: External ear normal.  Cardiovascular: Normal rate, regular rhythm and normal heart sounds.   No murmur heard. Pulmonary/Chest: Effort normal and breath sounds normal.  Abdominal: Soft. There is tenderness in the suprapubic area. There is no CVA tenderness.  Neurological: She is alert and oriented to person, place, and time. Gait normal.  Skin: Skin is warm and dry.  Psychiatric: Mood, memory, affect and judgment  normal.  Vitals reviewed.  Results for orders placed or performed in visit on 06/11/16  POCT Microscopic Urinalysis ([facility])  Result Value Ref Range   WBC,UR,HPF,POC Too numerous to count  (A) None WBC/hpf   RBC,UR,HPF,POC Many (A) None RBC/hpf   Bacteria Many (A) None, Too numerous to count   Mucus Absent Absent   Epithelial Cells, UR Per Microscopy Moderate (A) None, Too numerous to count cells/hpf  POCT urinalysis dipstick  Result Value Ref Range   Color, UA yellow yellow   Clarity, UA cloudy (A) clear   Glucose, UA negative negative   Bilirubin, UA negative negative   Ketones, POC UA negative negative   Spec Grav, UA 1.020    Blood, UA large (A) negative   pH, UA 5.5    Protein Ur, POC =30 (A) negative   Urobilinogen, UA 0.2    Nitrite, UA Positive (A) Negative   Leukocytes, UA small (1+) (A) Negative    Assessment and Plan :  Urinary frequency - Plan: POCT Microscopic Urinalysis ([facility]), POCT urinalysis dipstick, Urine culture  Acute cystitis with hematuria - Plan: nitrofurantoin, macrocrystal-monohydrate, (MACROBID) 100 MG capsule  Push fluids and finish all abx  Windell Hummingbird PA-C  [facility] [place] 06/11/2016 9:58 AM

## 2016-06-13 LAB — URINE CULTURE

## 2017-06-16 ENCOUNTER — Other Ambulatory Visit: Payer: Self-pay | Admitting: Obstetrics and Gynecology

## 2017-06-16 DIAGNOSIS — R928 Other abnormal and inconclusive findings on diagnostic imaging of breast: Secondary | ICD-10-CM

## 2017-06-20 ENCOUNTER — Ambulatory Visit
Admission: RE | Admit: 2017-06-20 | Discharge: 2017-06-20 | Disposition: A | Discharge status: Home / Self Care | Payer: BLUE CROSS/BLUE SHIELD | Source: Ambulatory Visit | Attending: Obstetrics and Gynecology | Admitting: Obstetrics and Gynecology

## 2017-06-20 DIAGNOSIS — R928 Other abnormal and inconclusive findings on diagnostic imaging of breast: Secondary | ICD-10-CM

## 2018-08-18 ENCOUNTER — Other Ambulatory Visit: Payer: Self-pay | Admitting: Obstetrics and Gynecology

## 2018-08-18 DIAGNOSIS — Z9189 Other specified personal risk factors, not elsewhere classified: Secondary | ICD-10-CM

## 2018-10-19 ENCOUNTER — Other Ambulatory Visit: Payer: Self-pay

## 2018-10-19 ENCOUNTER — Ambulatory Visit
Admission: RE | Admit: 2018-10-19 | Discharge: 2018-10-19 | Disposition: A | Discharge status: Home / Self Care | Payer: BLUE CROSS/BLUE SHIELD | Source: Ambulatory Visit | Attending: Obstetrics and Gynecology | Admitting: Obstetrics and Gynecology

## 2018-10-19 DIAGNOSIS — Z9189 Other specified personal risk factors, not elsewhere classified: Secondary | ICD-10-CM

## 2018-10-19 MED ORDER — GADOBUTROL 1 MMOL/ML IV SOLN
8.0000 mL | Freq: Once | INTRAVENOUS | Status: AC | PRN
  Administered 2018-10-19: 8 mL via INTRAVENOUS

## 2019-08-13 ENCOUNTER — Other Ambulatory Visit: Payer: Self-pay | Admitting: Obstetrics and Gynecology

## 2019-08-13 DIAGNOSIS — Z9189 Other specified personal risk factors, not elsewhere classified: Secondary | ICD-10-CM

## 2019-09-09 ENCOUNTER — Other Ambulatory Visit: Payer: Self-pay

## 2019-09-09 ENCOUNTER — Ambulatory Visit
Admission: RE | Admit: 2019-09-09 | Discharge: 2019-09-09 | Disposition: A | Discharge status: Home / Self Care | Payer: BC Managed Care – PPO | Source: Ambulatory Visit | Attending: Obstetrics and Gynecology | Admitting: Obstetrics and Gynecology

## 2019-09-09 DIAGNOSIS — Z9189 Other specified personal risk factors, not elsewhere classified: Secondary | ICD-10-CM

## 2019-09-09 MED ORDER — GADOBUTROL 1 MMOL/ML IV SOLN
10.0000 mL | Freq: Once | INTRAVENOUS | Status: AC | PRN
  Administered 2019-09-09: 10 mL via INTRAVENOUS

## 2019-09-30 ENCOUNTER — Encounter: Payer: Self-pay | Admitting: Obstetrics and Gynecology

## 2020-09-07 ENCOUNTER — Other Ambulatory Visit: Payer: Self-pay | Admitting: Obstetrics and Gynecology

## 2020-09-07 DIAGNOSIS — Z9189 Other specified personal risk factors, not elsewhere classified: Secondary | ICD-10-CM

## 2021-02-14 ENCOUNTER — Ambulatory Visit
Admission: RE | Admit: 2021-02-14 | Discharge: 2021-02-14 | Disposition: A | Discharge status: Home / Self Care | Payer: BC Managed Care – PPO | Source: Ambulatory Visit | Attending: Obstetrics and Gynecology | Admitting: Obstetrics and Gynecology

## 2021-02-14 ENCOUNTER — Other Ambulatory Visit: Payer: Self-pay

## 2021-02-14 DIAGNOSIS — Z9189 Other specified personal risk factors, not elsewhere classified: Secondary | ICD-10-CM

## 2021-02-14 MED ORDER — GADOBUTROL 1 MMOL/ML IV SOLN
9.0000 mL | Freq: Once | INTRAVENOUS | Status: AC | PRN
  Administered 2021-02-14: 9 mL via INTRAVENOUS

## 2021-11-07 ENCOUNTER — Other Ambulatory Visit: Payer: Self-pay | Admitting: Obstetrics and Gynecology

## 2021-11-07 DIAGNOSIS — Z1239 Encounter for other screening for malignant neoplasm of breast: Secondary | ICD-10-CM

## 2022-02-22 ENCOUNTER — Ambulatory Visit
Admission: RE | Admit: 2022-02-22 | Discharge: 2022-02-22 | Disposition: A | Discharge status: Home / Self Care | Payer: BC Managed Care – PPO | Source: Ambulatory Visit | Attending: Obstetrics and Gynecology | Admitting: Obstetrics and Gynecology

## 2022-02-22 DIAGNOSIS — Z1239 Encounter for other screening for malignant neoplasm of breast: Secondary | ICD-10-CM

## 2022-02-22 MED ORDER — GADOPICLENOL 0.5 MMOL/ML IV SOLN
9.0000 mL | Freq: Once | INTRAVENOUS | Status: AC | PRN
  Administered 2022-02-22: 9 mL via INTRAVENOUS

## 2022-07-26 IMAGING — MR MR BREAST BILAT WO/W CM
8 of 12 series · 33 of 48 positions shown · IV contrast (9 ml gadavist)
Comparison: 09/09/2019 MR and prior studies

CLINICAL DATA: 50-year-old female with high lifetime risk for
developing breast cancer (26%), for screening breast MRI.

LABS:  None performed today
EXAM:
BILATERAL BREAST MRI WITH AND WITHOUT CONTRAST
TECHNIQUE: Multiplanar, multisequence MR images of both breasts were obtained
prior to and following the intravenous administration of 9 ml of
Gadavist

[Series 2: t2_tirm_tra ipat (a-p) · axial · 3.0mm · 0.68mm/px · 1 of 60 slices shown]
[im 1/60]
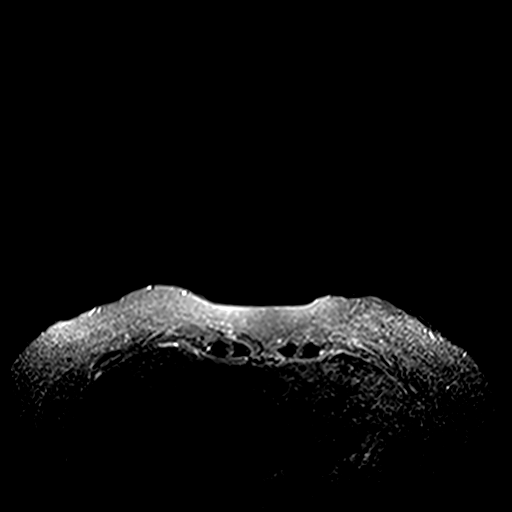

[Series 3: fl3d pre-cm non · axial · non-contrast · 1.2mm · 0.91mm/px · z∈[-98,+74]mm · 5 of 144 slices shown]
[im 1/144]
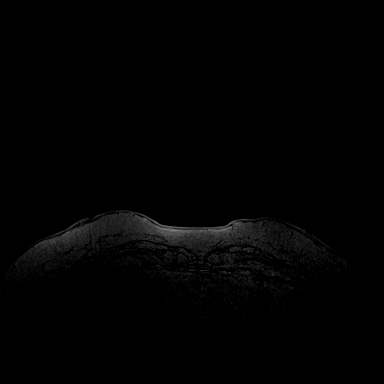
[im 36/144]
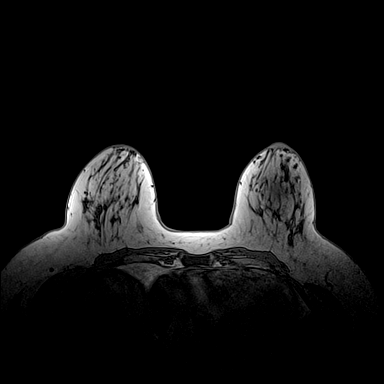
[im 72/144]
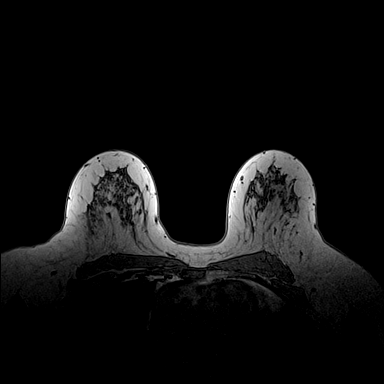
[im 108/144]
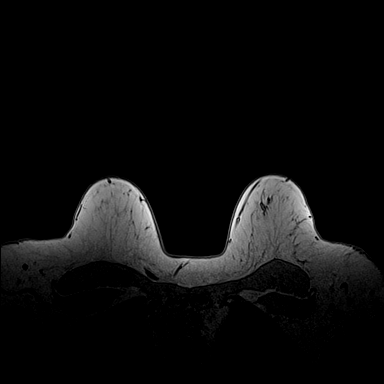
[im 144/144]
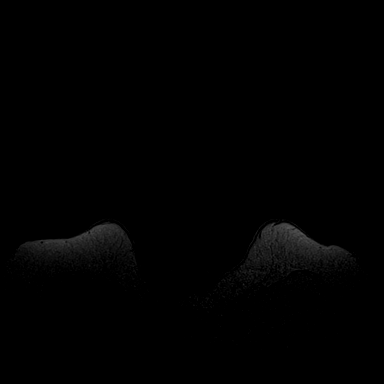

[Series 4: fl3d pre-cm · axial · non-contrast · 1.2mm · 0.91mm/px · z∈[-98,+74]mm · 5 of 144 slices shown]
[im 1/144]
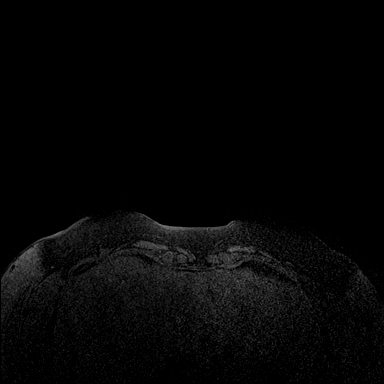
[im 36/144]
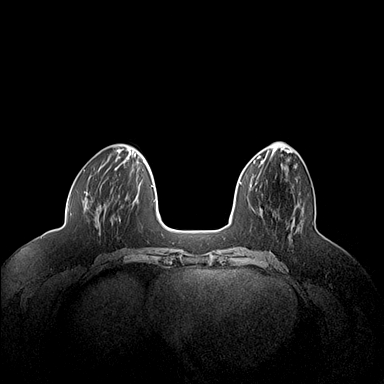
[im 72/144]
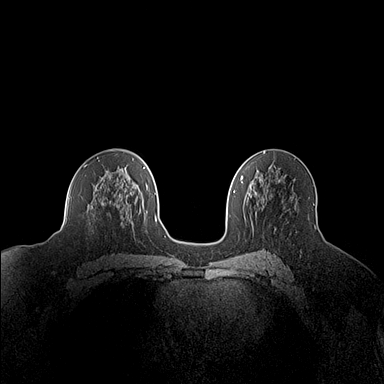
[im 108/144]
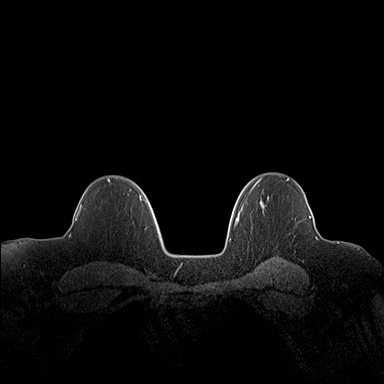
[im 144/144]
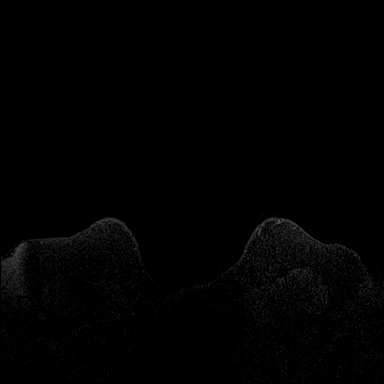

[Series 5: fl3d pre-cm 20 · axial · non-contrast · 1.2mm · 0.91mm/px · z∈[-98,+74]mm · 5 of 144 slices shown (1 of 3)]
[im 1/144]
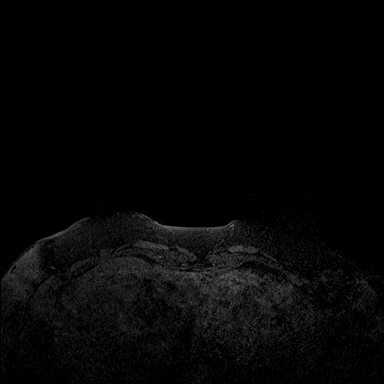
[im 36/144]
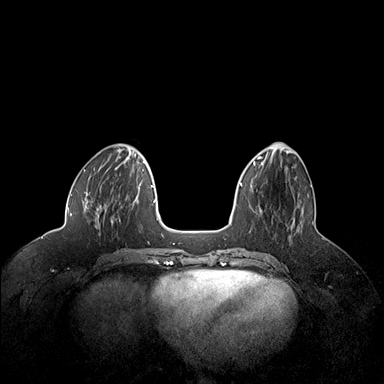
[im 72/144]
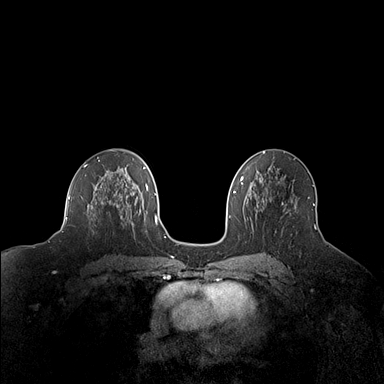
[im 108/144]
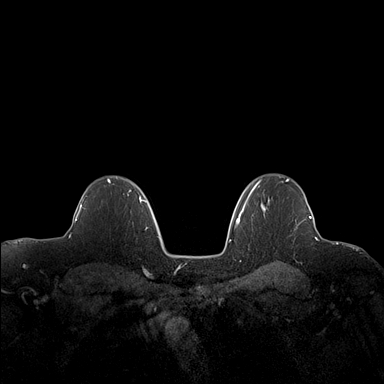
[im 144/144]
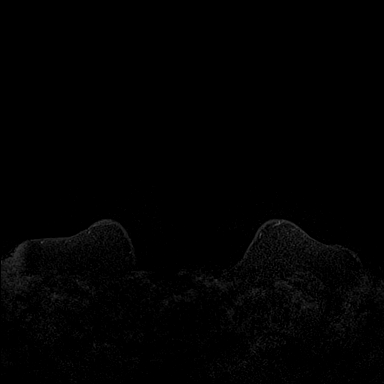

[Series 6: fl3d pre-cm 20 · axial · non-contrast · 1.2mm · 0.91mm/px · z∈[-98,+74]mm · 5 of 144 slices shown (2 of 3)]
[im 1/144]
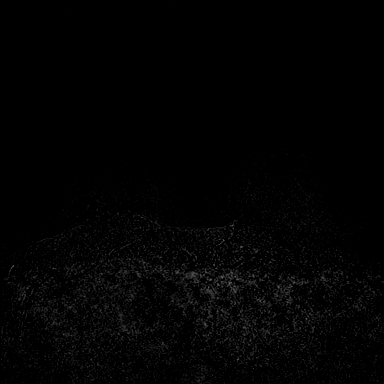
[im 36/144]
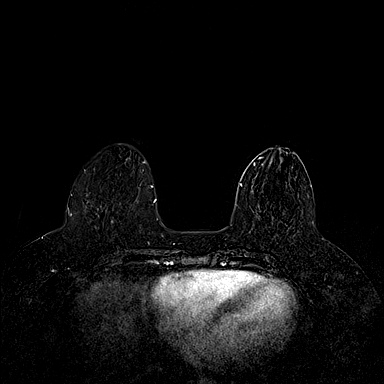
[im 72/144]
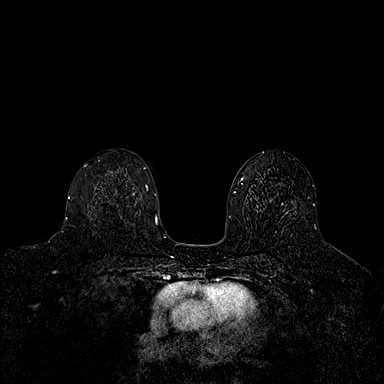
[im 108/144]
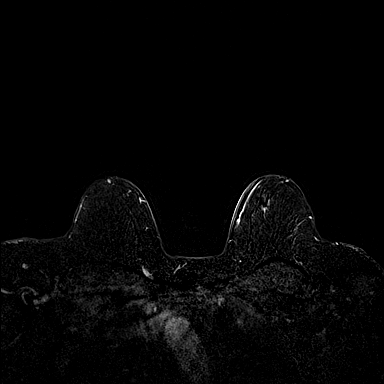
[im 144/144]
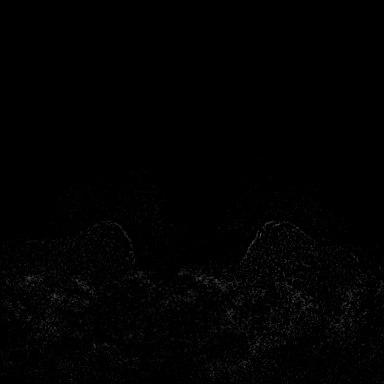

[Series 7: fl3d pre-cm 20 · axial · non-contrast · 172.8mm · 0.91mm/px · 1 of 1 slices shown (3 of 3)]
[im 1/1]
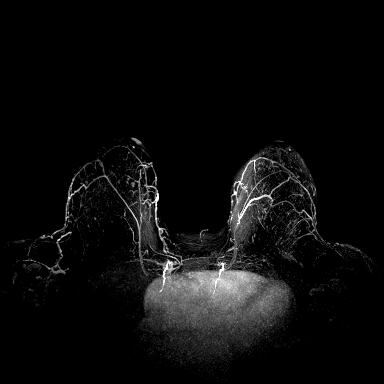

[Series 8: fl3d pre-cm 3min · axial · non-contrast · 1.2mm · 0.91mm/px · z∈[-98,+74]mm · 6 of 144 slices shown]
[im 1/144]
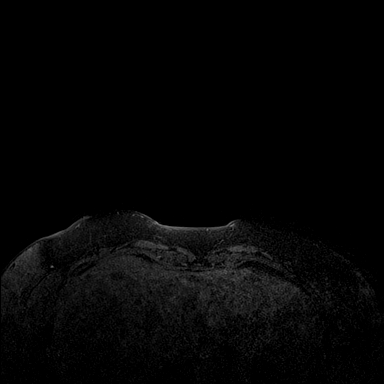
[im 29/144]
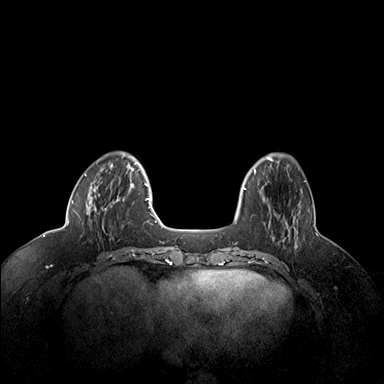
[im 58/144]
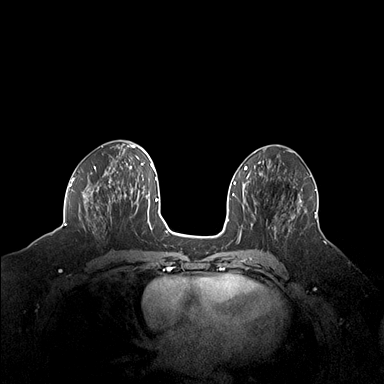
[im 86/144]
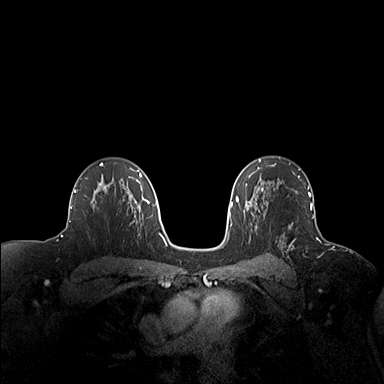
[im 115/144]
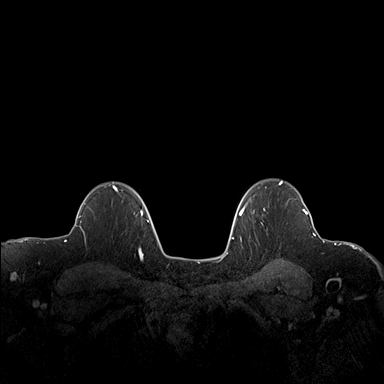
[im 144/144]
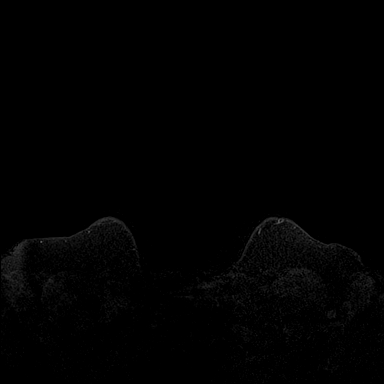

[Series 9: fl3d pre-cm 3min_sub · axial · non-contrast · 1.2mm · 0.91mm/px · z∈[-98,+39]mm · 5 of 144 slices shown]
[im 1/144]
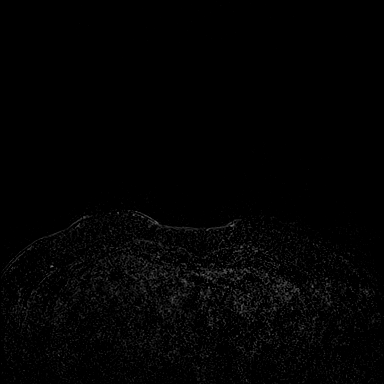
[im 29/144]
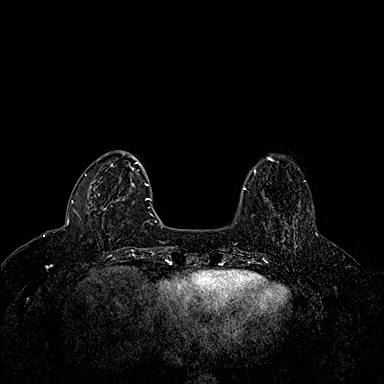
[im 58/144]
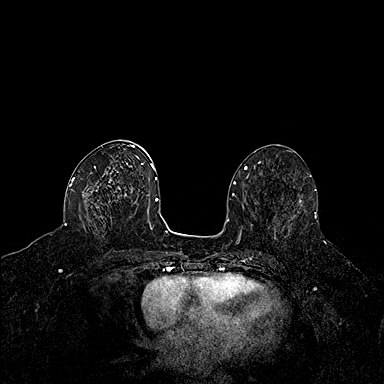
[im 86/144]
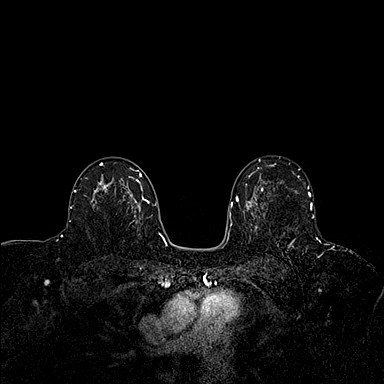
[im 115/144]
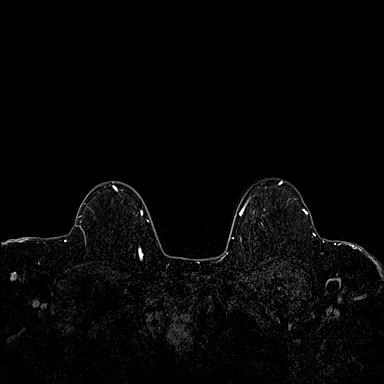

[33 of 48 positions shown; findings below may reference images not displayed]

Three-dimensional MR images were rendered by post-processing of the
original MR data on an independent workstation. The
three-dimensional MR images were interpreted, and findings are
reported in the following complete MRI report for this study. Three
dimensional images were evaluated at the independent interpreting
workstation using the [employer] thin client.
FINDINGS: Breast composition: c. Heterogeneous fibroglandular tissue.

Background parenchymal enhancement: Mild

Right breast: No mass or abnormal enhancement.

Left breast: No mass or abnormal enhancement.

Lymph nodes: No abnormal appearing lymph nodes.

Ancillary findings:  None.
IMPRESSION: No MR evidence of breast malignancy.

RECOMMENDATION:
Bilateral screening mammogram in 7 months to resume annual mammogram
schedule.

Bilateral screening breast MRI in 1 year, in this high risk patient.

BI-RADS CATEGORY  1: Negative.
